# Patient Record
Sex: Male | Born: 1950 | Race: White | Hispanic: No | Marital: Married
Health system: Midwestern US, Community
[De-identification: ages and names within clinical notes are randomized; demographics above are authoritative.]

## PROBLEM LIST (undated history)

## (undated) DIAGNOSIS — G4733 Obstructive sleep apnea (adult) (pediatric): Secondary | ICD-10-CM

## (undated) DIAGNOSIS — D649 Anemia, unspecified: Secondary | ICD-10-CM

## (undated) DIAGNOSIS — E119 Type 2 diabetes mellitus without complications: Secondary | ICD-10-CM

## (undated) DIAGNOSIS — I10 Essential (primary) hypertension: Secondary | ICD-10-CM

## (undated) DIAGNOSIS — I272 Pulmonary hypertension, unspecified: Secondary | ICD-10-CM

## (undated) DIAGNOSIS — B1920 Unspecified viral hepatitis C without hepatic coma: Secondary | ICD-10-CM

## (undated) DIAGNOSIS — J449 Chronic obstructive pulmonary disease, unspecified: Secondary | ICD-10-CM

## (undated) DIAGNOSIS — M719 Bursopathy, unspecified: Secondary | ICD-10-CM

## (undated) HISTORY — DX: Anemia, unspecified: D64.9

## (undated) HISTORY — DX: Chronic obstructive pulmonary disease, unspecified: J44.9

## (undated) HISTORY — DX: Obstructive sleep apnea (adult) (pediatric): G47.33

## (undated) HISTORY — DX: Pulmonary hypertension, unspecified: I27.20

## (undated) HISTORY — DX: Type 2 diabetes mellitus without complications: E11.9

## (undated) HISTORY — DX: Unspecified viral hepatitis C without hepatic coma: B19.20

## (undated) HISTORY — DX: Essential (primary) hypertension: I10

## (undated) HISTORY — PX: TRANSURETHRAL RESECTION OF PROSTATE: SHX73

## (undated) HISTORY — DX: Bursopathy, unspecified: M71.9

---

## 2001-07-17 ENCOUNTER — Emergency Department (HOSPITAL_COMMUNITY): Admission: EM | Admit: 2001-07-17 | Discharge: 2001-07-17 | Payer: Self-pay | Admitting: *Deleted

## 2004-05-26 ENCOUNTER — Emergency Department (HOSPITAL_COMMUNITY): Admission: EM | Admit: 2004-05-26 | Discharge: 2004-05-26 | Payer: Self-pay | Admitting: Emergency Medicine

## 2010-01-13 ENCOUNTER — Emergency Department (HOSPITAL_COMMUNITY): Admission: EM | Admit: 2010-01-13 | Discharge: 2010-01-13 | Payer: Self-pay | Admitting: Emergency Medicine

## 2010-05-20 ENCOUNTER — Emergency Department (HOSPITAL_COMMUNITY): Admission: EM | Admit: 2010-05-20 | Discharge: 2010-05-20 | Payer: Self-pay | Admitting: Emergency Medicine

## 2010-06-05 ENCOUNTER — Ambulatory Visit: Payer: Self-pay | Admitting: Cardiology

## 2010-06-05 DIAGNOSIS — G4733 Obstructive sleep apnea (adult) (pediatric): Secondary | ICD-10-CM

## 2010-06-05 DIAGNOSIS — I517 Cardiomegaly: Secondary | ICD-10-CM | POA: Insufficient documentation

## 2010-06-05 DIAGNOSIS — I10 Essential (primary) hypertension: Secondary | ICD-10-CM | POA: Insufficient documentation

## 2010-06-05 DIAGNOSIS — J441 Chronic obstructive pulmonary disease with (acute) exacerbation: Secondary | ICD-10-CM | POA: Insufficient documentation

## 2010-06-07 ENCOUNTER — Encounter: Payer: Self-pay | Admitting: Cardiology

## 2010-06-11 ENCOUNTER — Telehealth (INDEPENDENT_AMBULATORY_CARE_PROVIDER_SITE_OTHER): Payer: Self-pay

## 2010-06-21 ENCOUNTER — Encounter: Payer: Self-pay | Admitting: Cardiology

## 2010-06-28 ENCOUNTER — Encounter: Payer: Self-pay | Admitting: Cardiology

## 2010-07-26 ENCOUNTER — Ambulatory Visit
Admission: RE | Admit: 2010-07-26 | Discharge: 2010-07-26 | Payer: Self-pay | Source: Home / Self Care | Attending: Cardiology | Admitting: Cardiology

## 2010-07-26 DIAGNOSIS — G4730 Sleep apnea, unspecified: Secondary | ICD-10-CM | POA: Insufficient documentation

## 2010-07-26 DIAGNOSIS — I10 Essential (primary) hypertension: Secondary | ICD-10-CM | POA: Insufficient documentation

## 2010-07-26 DIAGNOSIS — I2789 Other specified pulmonary heart diseases: Secondary | ICD-10-CM | POA: Insufficient documentation

## 2010-08-15 NOTE — Letter (Signed)
SummaryPricilla Holm RECORDS  [facility] RECORDS   Imported By: Faythe Ghee 06/07/2010 10:26:49  _____________________________________________________________________  External Attachment:    Type:   Image     Comment:   External Document

## 2010-08-15 NOTE — Letter (Signed)
Summary: External Correspondence  External Correspondence   Imported By: Faythe Ghee 06/28/2010 09:40:01  _____________________________________________________________________  External Attachment:    Type:   Image     Comment:   External Document

## 2010-08-15 NOTE — Progress Notes (Signed)
**Note De-Identified Harl Wiechmann Obfuscation** Summary: FYI: pt. sees Dr. Pincus Badder at [facility]  Phone Note Call from Patient   Caller: Patient Summary of Call: patient wanted to let you know that he sees Dr.Mullany at the [facility] / phone number is [phone] / tg Initial call taken by: Raechel Ache [facility],  June 11, 2010 11:15 AM  Follow-up for Phone Call        Noted and changed in chart. Follow-up by: Larita Fife Rachael Ferrie LPN,  June 11, 2010 11:39 AM

## 2010-08-15 NOTE — Letter (Signed)
Summary: [facility]   [facility]   Imported By: Claudette Laws 07/09/2010 13:45:54  _____________________________________________________________________  External Attachment:    Type:   Image     Comment:   External Document

## 2010-08-15 NOTE — Assessment & Plan Note (Signed)
Summary: ROV /6 WEEKS   Visit Type:  Follow-up Referring Provider:  Dr. Cherie Ouch Primary Provider:  Dr. Quintin Alto   History of Present Illness: 60 year old male presents for followup. He was seen initially back in November 2011, with previous workup done through the [facility] hospital system. Records reviewed from the [facility] show that he has been followed with a diagnoses of COPD, obstructive sleep apnea, morbid obesity, type diabetes mellitus, hepatitis C, and shoulder bursitis with adhesive capsulitis. He has a reported FEV1 of 1.98 with prior documentation of enlarged pulmonary arteries on chest CT scan.   Echocardiogram dated April 08, 2010 showed left ventricle at normal size with mild LVH, LVEF greater than 55% with grade 1 diastolic dysfunction. Right ventricle mildly dilated with normal function. Mild biatrial enlargement. RVSP greater than 60 mm mercury with description of severe pulmonary artery dilatation. No pericardial effusion. No other major valvular abnormalities.  I referred him to Dr. Orson Aloe back in December 2011 for pulmonary followup. He is also establishing with Dr. Leandrew Koyanagi soon.  We discussed his echocardiogram results. It is most likely that he has pulmonary hypertension related to chronic lung disease and obstructive sleep apnea. He has no evidence of left ventricular dysfunction or valvular abnormalities to explain this.  Reports stable dyspnea on exertion, no cough or hemoptysis.  Current Medications (verified): 1)  Fish Oil 1000 Mg Caps (Omega-3 Fatty Acids) .... 2 Tablets Three Times A Day 2)  Glyburide 5 Mg Tabs (Glyburide) .... 2 Tablets By Mouth Two Times A Day 3)  Hydrochlorothiazide 25 Mg Tabs (Hydrochlorothiazide) .... Take 1/2 Tablet By Mouth Once Daily 4)  Hydrocodone-Acetaminophen 5-500 Mg Tabs (Hydrocodone-Acetaminophen) .... Take 1 Tablet By Mouth Every 4 Hours As Needed For Pain 5)  Losartan Potassium 25 Mg Tabs (Losartan  Potassium) .... 1/2 Tablet By Mouth Once Daily 6)  Metformin Hcl 1000 Mg Tabs (Metformin Hcl) .Marland Kitchen.. 1 Tablet in Am and 1 and 1/2 At Night 7)  Pramipexole Dihydrochloride 0.125 Mg Tabs (Pramipexole Dihydrochloride) .Marland Kitchen.. 1 Tablet By Mouth At Bedtime 8)  Simvastatin 80 Mg Tabs (Simvastatin) .... Take 1 Tablet By Mouth Once Daily 9)  Topamax 25 Mg Tabs (Topiramate) .Marland Kitchen.. 1 Tablet Two Times A Day 10)  Asmanex 30 Metered Doses 220 Mcg/inh Aepb (Mometasone Furoate) .... 2 Puffs At Bedtime 11)  Foradil Aerolizer 12 Mcg Caps (Formoterol Fumarate) .... Inhale Two Times A Day 12)  Omeprazole 20 Mg Cpdr (Omeprazole) .... Take 1 Tab Daily 13)  Proair Hfa 108 (90 Base) Mcg/act Aers (Albuterol Sulfate) .... As Needed 14)  Ammonium Lactate 12 % Lotn (Ammonium Lactate) .... Apply Two Times A Day 15)  Capsaicin 0.025 % Crea (Capsaicin) .... Apply Three Times A Day 16)  Hydrocortisone 2.5 % Crea (Hydrocortisone) .... Apply Two Times A Day  Allergies (verified): No Known Drug Allergies  Comments:  Nurse/Medical Assistant: patient uses [facility]  Past History:  Social History: Last updated: 06/05/2010 Tobacco Use - Former, quit December 2007 Alcohol Use - no Single - lives with elderly mother Disabled - several jobs over time, most recently in [employer] estate down in [place]  Past Medical History: COPD, FEV1 1.9 Hypertension Anemia Diabetes Type 2 Obstructive sleep apnea - CPAP Hepatitis C Shoulder bursitis with adhesive capsulitis Pulmonary hypertension  Review of Systems       The patient complains of dyspnea on exertion and peripheral edema.  The patient denies anorexia, fever, weight loss, chest pain, syncope, prolonged cough, headaches, hemoptysis, abdominal pain, melena,  and hematochezia.         Otherwise reviewed and negative.  Vital Signs:  Patient profile:   60 year old male Weight:      268 pounds BMI:     38.59 Pulse rate:   87 / minute BP sitting:   156 / 93  (left arm)  Vitals  Entered By: Dreama Saa, CNA (July 26, 2010 12:43 PM)  Physical Exam  Additional Exam:  Morbidly obese male in no acute distress. HEENT: Conjunctiva and lids normal, oropharynx with moist mucosa. Neck: Supple, no elevated JVP without bruits, increased girth. Lungs: Diminished breath sounds throughout, and nonlabored and no active wheezing. Cardiac: Indistinct PMI with regular rate and rhythm, no obvious S3 without systolic murmur. Abdomen: Obese, nontender, no obvious hepatomegaly, bowel sounds present. Extremities: Venous stasis changes right greater than left with varicosities also noted. No obvious cords. Chronic appearing edema that is relatively symmetrical, 1+ overall. Musculoskeletal: No gross deformities. Skin: Otherwise warm and dry. Neuropsychiatric: Alert and oriented x3, affect appropriate.   Impression & Recommendations:  Problem # 1:  OTHER CHRONIC PULMONARY HEART DISEASES (ICD-416.8)  Moderate pulmonary hypertension, most likely related to chronic lung disease and obstructive sleep apnea. Echocardiogram done through the [facility] hospital system back in September 2011 demonstrated normal left ventricular systolic function without major valvular abnormalities. He now has regular pulmonary followup scheduled with Dr. Orson Aloe will be establishing with Dr. Leandrew Koyanagi for primary care. Could always consider the possibility of a CT angiogram of the chest to exclude thromboembolic disease, although this seems less likely. Otherwise we will plan a followup echocardiogram over the next 6 months for reassessment, sooner if his symptoms worsen.  Problem # 2:  COPD (ICD-496)  Followed by Dr. Orson Aloe.  His updated medication list for this problem includes:    Asmanex 30 Metered Doses 220 Mcg/inh Aepb (Mometasone furoate) .Marland Kitchen... 2 puffs at bedtime    Foradil Aerolizer 12 Mcg Caps (Formoterol fumarate) ..... Inhale two times a day    Proair Hfa 108 (90 Base) Mcg/act Aers (Albuterol  sulfate) .Marland Kitchen... As needed  Problem # 3:  SLEEP APNEA, OBSTRUCTIVE (ICD-327.23)  Followed by Dr. Orson Aloe.  Problem # 4:  MORBID OBESITY (ICD-278.01)  Diet and weight loss were discussed.  Problem # 5:  ESSENTIAL HYPERTENSION, BENIGN (ICD-401.1)  Blood pressure elevated today. Patient states he he took his medications late today. I asked him to keep an eye on this, reviewed sodium restriction, and also weight loss. He may need further up titration of medical therapy by primary care over time.  His updated medication list for this problem includes:    Hydrochlorothiazide 25 Mg Tabs (Hydrochlorothiazide) .Marland Kitchen... Take 1/2 tablet by mouth once daily    Losartan Potassium 25 Mg Tabs (Losartan potassium) .Marland Kitchen... 1/2 tablet by mouth once daily  Other Orders: 2-D Echocardiogram (2D Echo)  Patient Instructions: 1)  Your physician recommends that you schedule a follow-up appointment in: 6months 2)  Your physician recommends that you continue on your current medications as directed. Please refer to the Current Medication list given to you today. 3)  Your physician has requested that you have an echocardiogram.  Echocardiography is a painless test that uses sound waves to create images of your heart. It provides your doctor with information about the size and shape of your heart and how well your heart's chambers and valves are working.  This procedure takes approximately one hour. There are no restrictions for this procedure.

## 2010-08-15 NOTE — Assessment & Plan Note (Signed)
Summary: Bradley [facility] for [facility]   Visit Type:  Initial Consult Referring Provider:  Dr. Valeria Batman Primary Provider:  [facility] system   History of Present Illness: 60 year old male referred for cardiology consultation. Presentation is somewhat convoluted. In speaking with the patient, he reportedly follows with both a primary care provider and a pulmonologist through the [facility] system. He is trying to establish followup closer to this area however. He states he underwent an echocardiogram back in September, and that his pulmonologist called him to tell him that his right heart was somewhat enlarged, probably related to "strain." He tells me that it was explained that he should go to the emergency department for further evaluation. He indicates that he had been experiencing some chest discomfort and shortness of breath in the evenings around that time. He also indicates that he had not been on CPAP for several months, since he was waiting on proper tubing to arrive for a new nasal mask.  Records indicate that he was seen in the [facility] emergency department back on 7 November with chest pain. ECG revealed normal sinus rhythm at 80 beats per minutes, no acute ST segment changes. Laboratory data showed hemoglobin 14.8, platelets 173, troponin I less than 0.05, CK-MB 8.0, sodium 137, potassium 4.0, BUN 16, creatinine 1.3. Chest x-ray was also obtained reporting patchy basilar opacities suggestive of atelectasis, but no obvious consolidation. Heart size and mediastinal contours are described as normal in the final report.  Bradley Fuller states that he is now back on CPAP for at least the last week, starting to feel better. He does not endorse any truly exertional chest pain. He has occasional cough, no hemoptysis. He states he has tried to find a primary care provider in [place], however has been unsuccessful.  He reports no personal history of CAD or myocardial  infarction. He has had chronic problems with lower extremity edema, also stasis changes.  Preventive Screening-Counseling & Management  Alcohol-Tobacco     Smoking Status: quit  Current Medications (verified): 1)  Fish Oil 1000 Mg Caps (Omega-3 Fatty Acids) .... 2 Tablets Three Times A Day 2)  Glyburide 5 Mg Tabs (Glyburide) .... 2 Tablets By Mouth Two Times A Day 3)  Hydrochlorothiazide 25 Mg Tabs (Hydrochlorothiazide) .... Take 1/2 Tablet By Mouth Once Daily 4)  Hydrocodone-Acetaminophen 5-500 Mg Tabs (Hydrocodone-Acetaminophen) .... Take 1 Tablet By Mouth Every 4 Hours As Needed For Pain 5)  Losartan Potassium 25 Mg Tabs (Losartan Potassium) .... 1/2 Tablet By Mouth Once Daily 6)  Metformin Hcl 1000 Mg Tabs (Metformin Hcl) .Marland Kitchen.. 1 Tablet in Am and 1 and 1/2 At Night 7)  Pramipexole Dihydrochloride 0.125 Mg Tabs (Pramipexole Dihydrochloride) .Marland Kitchen.. 1 Tablet By Mouth At Bedtime 8)  Simvastatin 80 Mg Tabs (Simvastatin) .... Take 1 Tablet By Mouth Once Daily 9)  Topamax 25 Mg Tabs (Topiramate) .Marland Kitchen.. 1 Tablet Two Times A Day 10)  Asmanex 30 Metered Doses 220 Mcg/inh Aepb (Mometasone Furoate) .... 2 Puffs At Bedtime 11)  Foradil Aerolizer 12 Mcg Caps (Formoterol Fumarate) .... Inhale Two Times A Day  Allergies (verified): No Known Drug Allergies  Past History:  Family History: Last updated: 07-05-2010 Father: died age 55 with throat cancer Mother: alive age 53 Family History of Diabetes  Social History: Last updated: 2010-07-05 Tobacco Use - Former, quit December 2007 Alcohol Use - no Single - lives with elderly mother Disabled - several jobs over time, most recently in [employer] estate down  in [place]  Past Medical History: COPD Hypertension Anemia Diabetes Type 2 Obstructive sleep apnea - CPAP  Past Surgical History: TURP  Family History: Father: died age 69 with throat cancer Mother: alive age 23 Family History of Diabetes  Social History: Tobacco Use - Former, quit  December 2007 Alcohol Use - no Single - lives with elderly mother Disabled - several jobs over time, most recently in [employer] estate down in [place] Smoking Status:  quit  Review of Systems  The patient denies anorexia, fever, weight loss, syncope, prolonged cough, headaches, hemoptysis, abdominal pain, melena, and hematochezia.         Otherwise negative except as outlined above.  Vital Signs:  Patient profile:   60 year old male Height:      70 inches Weight:      267 pounds BMI:     38.45 O2 Sat:      97 % on Room air Pulse rate:   80 / minute BP sitting:   127 / 85  (left arm)  O2 Flow:  Room air  Physical Exam  Additional Exam:  Morbidly obese male in no acute distress. HEENT: Conjunctiva and lids normal, oropharynx with moist mucosa. Neck: Supple, no elevated JVP without bruits, increased girth. Lungs: Diminished breath sounds throughout, and nonlabored and no active wheezing. Cardiac: Indistinct PMI with regular rate and rhythm, no obvious S3 without systolic murmur. Abdomen: Obese, nontender, no obvious hepatomegaly, bowel sounds present. Extremities: Venous stasis changes right greater than left with varicosities also noted. No obvious cords. Chronic appearing edema that is relatively symmetrical, 1+ overall. Musculoskeletal: No gross deformities. Skin: Otherwise warm and dry. Neuropsychiatric: Alert and oriented x3, affect appropriate.   CXR  Procedure date:  05/20/2010  Findings:      CHEST - 2 VIEW    Comparison: None.    Findings: There are low lung volumes.  The heart size and   mediastinal contours are normal.  Patchy opacities in both lung   bases are most consistent with atelectasis.  There is no   consolidation, edema or pleural effusion.  No acute osseous   findings are identified.    IMPRESSION:   Patchy basilar opacities most consistent with atelectasis.  No   consolidation.  EKG  Procedure date:  06/05/2010  Findings:      Normal sinus  rhythm at 74 beats per minute.  Impression & Recommendations:  Problem # 1:  CARDIOMEGALY (ICD-429.3)  Details regarding this diagnosis are pending. Actually his heart size appeared normal by recent chest x-ray, although based on his description, it sounds like he may have been found to have some degree of right ventricular enlargement on echocardiogram done through the [facility] system back in September. This could certainly be the case in the setting of COPD, morbid obesity, and obstructive sleep apnea. His ECG does not reveal any obvious RV strain pattern however, and is otherwise normal. Plan is to obtain the patient's echocardiogram report from September. We may not need to pursue further testing at this time unless he manifests any progressive symptoms. I will bring him back to discuss the results.  Problem # 2:  ESSENTIAL HYPERTENSION, BENIGN (ICD-401.1)  Blood pressure control today.  His updated medication list for this problem includes:    Hydrochlorothiazide 25 Mg Tabs (Hydrochlorothiazide) .Marland Kitchen... Take 1/2 tablet by mouth once daily    Losartan Potassium 25 Mg Tabs (Losartan potassium) .Marland Kitchen... 1/2 tablet by mouth once daily  Problem # 3:  COPD (  ICD-496)  It sounds as if the patient underwent pulmonary function testing down in [place]. He has had a pulmonologist through the [facility] system. No other details are available. In light of this and his obstructive sleep apnea, he would like to obtain closer pulmonary followup. Will make a referral to Dr. Orson Aloe in [place].  His updated medication list for this problem includes:    Asmanex 30 Metered Doses 220 Mcg/inh Aepb (Mometasone furoate) .Marland Kitchen... 2 puffs at bedtime    Foradil Aerolizer 12 Mcg Caps (Formoterol fumarate) ..... Inhale two times a day  Orders: Pulmonary Referral (Pulmonary)  Problem # 4:  SLEEP APNEA, OBSTRUCTIVE (ICD-327.23)  Reportedly back on CPAP and feeling somewhat better. As noted above, will make  referral to Dr. Orson Aloe in [place].  Problem # 5:  MORBID OBESITY (ICD-278.01)  Weight loss was discussed. Certainly needs to continue risk factor modification strategies. He does need to establish with a primary care provider to assist with this. We gave him names of some physician practices in [place].  Patient Instructions: 1)  Your physician recommends that you schedule a follow-up appointment in: 6 WEEKS 2)  You have been referred to PRIMARY CARE DOCTOR AND PULMONONOLOGIST  Prevention & Chronic Care Immunizations   Influenza vaccine: Not documented    Tetanus booster: Not documented    Pneumococcal vaccine: Not documented    H. zoster vaccine: Not documented  Colorectal Screening   Hemoccult: Not documented    Colonoscopy: Not documented  Other Screening   PSA: Not documented   Smoking status: quit  (06/05/2010)    Screening comments: quit at age 82  Lipids   Total Cholesterol: Not documented   LDL: Not documented   LDL Direct: Not documented   HDL: Not documented   Triglycerides: Not documented  Hypertension   Last Blood Pressure: 127 / 85  (06/05/2010)   Serum creatinine: Not documented   Serum potassium Not documented  Self-Management Support :    Hypertension self-management support: Not documented   Appended Document: Bradley Fuller seen at [facility] for [facility] Records reviewed from [facility]. He hs ben followed with a diagnoses of COPD, obstructive sleep apnea, morbid obesity, type diabetes mellitus, hepatitis C, and shoulder bursitis with adhesive capsulitis. He has a reported FEV1 of 1.98 with prior documentation of enlarged pulmonary arteries on chest CT scan. Unfortunately no echocardiogram report was sent.  Appended Document: Bradley Fuller seen at [facility] for chest pain Received echocardiogram report from the [facility] system. Study dated April 08, 2010. Left ventricle described as normal size with mild LVH, LVEF greater than 55% with grade 1  diastolic dysfunction. Right ventricle mildly dilated with normal function. Mild biatrial enlargement. RVSP greater than 60 mm mercury with description of severe pulmonary artery dilatation. No pericardial effusion. No other major valvular abnormalities.

## 2010-09-24 LAB — BASIC METABOLIC PANEL
Calcium: 9.6 mg/dL (ref 8.4–10.5)
Creatinine, Ser: 1.3 mg/dL (ref 0.4–1.5)
GFR calc Af Amer: >60 mL/min (ref >60)

## 2010-09-24 LAB — POCT CARDIAC MARKERS
CKMB, poc: 8 ng/mL (ref 1.0–8.0)
Troponin i, poc: <0.05 ng/mL (ref 0.00–0.09)

## 2010-09-24 LAB — DIFFERENTIAL
Lymphocytes Relative: 40 % (ref 12–46)
Lymphs Abs: 3.1 10*3/uL (ref 0.7–4.0)
Neutrophils Relative %: 47 % (ref 43–77)

## 2010-09-24 LAB — CBC
Platelets: 171 10*3/uL (ref 150–400)
RBC: 4.67 MIL/uL (ref 4.22–5.81)
WBC: 7.6 10*3/uL (ref 4.0–10.5)

## 2010-09-24 LAB — D-DIMER, QUANTITATIVE: D-Dimer, Quant: 0.22 ug/mL-FEU (ref 0.00–0.48)

## 2010-09-27 ENCOUNTER — Encounter: Payer: Self-pay | Admitting: Cardiology

## 2010-10-10 NOTE — Letter (Signed)
Summary: dr Orson Aloe office note  dr Orson Aloe office note   Imported By: Faythe Ghee 09/27/2010 16:44:53  _____________________________________________________________________  External Attachment:    Type:   Image     Comment:   External Document

## 2011-01-23 ENCOUNTER — Encounter: Payer: Self-pay | Admitting: Cardiology

## 2011-01-30 ENCOUNTER — Ambulatory Visit (HOSPITAL_COMMUNITY)
Admission: RE | Admit: 2011-01-30 | Discharge: 2011-01-30 | Disposition: A | Discharge status: Home / Self Care | Payer: Medicare Other | Source: Ambulatory Visit | Attending: Cardiology | Admitting: Cardiology

## 2011-01-30 DIAGNOSIS — I517 Cardiomegaly: Secondary | ICD-10-CM | POA: Insufficient documentation

## 2011-01-30 DIAGNOSIS — I10 Essential (primary) hypertension: Secondary | ICD-10-CM | POA: Insufficient documentation

## 2011-01-30 DIAGNOSIS — J449 Chronic obstructive pulmonary disease, unspecified: Secondary | ICD-10-CM | POA: Insufficient documentation

## 2011-01-30 DIAGNOSIS — J4489 Other specified chronic obstructive pulmonary disease: Secondary | ICD-10-CM | POA: Insufficient documentation

## 2011-01-30 DIAGNOSIS — I369 Nonrheumatic tricuspid valve disorder, unspecified: Secondary | ICD-10-CM

## 2011-01-30 DIAGNOSIS — G4733 Obstructive sleep apnea (adult) (pediatric): Secondary | ICD-10-CM | POA: Insufficient documentation

## 2011-01-30 NOTE — Progress Notes (Signed)
*  PRELIMINARY RESULTS* Echocardiogram 2D Echocardiogram has been performed.  Bradley Fuller 01/30/2011, 8:59 AM

## 2011-01-31 ENCOUNTER — Ambulatory Visit (INDEPENDENT_AMBULATORY_CARE_PROVIDER_SITE_OTHER): Payer: Medicare Other | Admitting: Cardiology

## 2011-01-31 ENCOUNTER — Encounter: Payer: Self-pay | Admitting: Cardiology

## 2011-01-31 VITALS — BP 124/88 | HR 82 | Ht 70.0 in | Wt 265.0 lb

## 2011-01-31 DIAGNOSIS — I2789 Other specified pulmonary heart diseases: Secondary | ICD-10-CM

## 2011-01-31 DIAGNOSIS — E782 Mixed hyperlipidemia: Secondary | ICD-10-CM

## 2011-01-31 DIAGNOSIS — I10 Essential (primary) hypertension: Secondary | ICD-10-CM

## 2011-01-31 NOTE — Patient Instructions (Signed)
**Note De-identified Bradley Fuller Obfuscation** Your physician recommends that you continue on your current medications as directed. Please refer to the Current Medication list given to you today.  Your physician recommends that you schedule a follow-up appointment in: As needed 

## 2011-01-31 NOTE — Assessment & Plan Note (Signed)
Blood pressure trend is better. Continue medical therapy, and follow up with Dr. Leandrew Koyanagi.

## 2011-01-31 NOTE — Progress Notes (Signed)
Clinical Summary Bradley Fuller is a 60 y.o.male presenting for followup. He was seen back in January. Followup echocardiogram is noted below. I reviewed the images. There was only trace to mild tricuspid regurgitation without clear evidence of significantly increased transvalvular velocity to suggest pulmonary hypertension. This would represent a significant improvement compared to the prior study report.  We reviewed the results today. He reports feeling fairly well, no progressive shortness of breath, no cough or hemoptysis, no chest pain. ECG is noted below.  He did have recent followup lab work through the [facility] system showing potassium 4.4, BUN 20, creatinine 1.5, hemoglobin A1c down to 8.1% from 12.2% last year. Lipids from last year showed LDL 74. He is now following with Dr. Leandrew Koyanagi and Dr. Orson Aloe.   No Known Allergies  Current outpatient prescriptions:ammonium lactate (LAC-HYDRIN) 12 % lotion, Apply 1 application topically 2 (two) times daily.  , Disp: , Rfl: ;  budesonide-formoterol (SYMBICORT) 160-4.5 MCG/ACT inhaler, Inhale 2 puffs into the lungs 2 (two) times daily.  , Disp: , Rfl: ;  capsaicin (ZOSTRIX) 0.025 % cream, Apply topically 3 (three) times daily.  , Disp: , Rfl: ;  Cetirizine HCl 10 MG CAPS, Take 1 capsule by mouth daily.  , Disp: , Rfl:  fluticasone (FLONASE) 50 MCG/ACT nasal spray, Place 2 sprays into the nose daily.  , Disp: , Rfl: ;  formoterol (FORADIL) 12 MCG capsule for inhaler, Place 12 mcg into inhaler and inhale 2 (two) times daily.  , Disp: , Rfl: ;  glyBURIDE (DIABETA) 5 MG tablet, Take 10 mg by mouth 2 (two) times daily with a meal.  , Disp: , Rfl: ;  hydrochlorothiazide 25 MG tablet, Take 12.5 mg by mouth daily.  , Disp: , Rfl:  HYDROcodone-acetaminophen (VICODIN) 5-500 MG per tablet, Take 1 tablet by mouth every 6 (six) hours as needed.  , Disp: , Rfl: ;  hydrocortisone 2.5 % cream, Apply 1 application topically 2 (two) times daily.  , Disp: , Rfl: ;  losartan  (COZAAR) 25 MG tablet, Take 12.5 mg by mouth daily.  , Disp: , Rfl: ;  metFORMIN (GLUCOPHAGE) 1000 MG tablet, Take 1 tablet in the AM and 1.5 tablet in the PM , Disp: , Rfl:  mometasone (ASMANEX) 220 MCG/INH inhaler, Inhale 2 puffs into the lungs daily.  , Disp: , Rfl: ;  Omega-3 Fatty Acids (FISH OIL) 1000 MG CAPS, Take 2 capsules by mouth 3 (three) times daily.  , Disp: , Rfl: ;  omeprazole (PRILOSEC) 20 MG capsule, Take 20 mg by mouth daily.  , Disp: , Rfl: ;  pramipexole (MIRAPEX) 0.125 MG tablet, Take 0.125 mg by mouth at bedtime.  , Disp: , Rfl:  simvastatin (ZOCOR) 80 MG tablet, Take 80 mg by mouth at bedtime.  , Disp: , Rfl: ;  topiramate (TOPAMAX) 25 MG tablet, Take 25 mg by mouth 2 (two) times daily.  , Disp: , Rfl:   Past Medical History  Diagnosis Date  . COPD (chronic obstructive pulmonary disease)     FEV1 1.9  . Essential hypertension, benign   . Anemia   . Type 2 diabetes mellitus   . OSA (obstructive sleep apnea)     CPAP  . Hepatitis C   . Bursitis     Shoulder, with adhesive capsulitis  . Pulmonary hypertension     Secondary    Past Surgical History  Procedure Date  . Transurethral resection of prostate     Family History  Problem  Relation Age of Onset  . Throat cancer Father   . Diabetes      Social History Bradley Fuller reports that he quit smoking about 4 years ago. His smoking use included Cigarettes. He has never used smokeless tobacco. Bradley Fuller reports that he does not drink alcohol.  Review of Systems Otherwise reviewed and negative.  Physical Examination Filed Vitals:   01/31/11 1414  BP: 124/88  Pulse: 82  Morbidly obese male in no acute distress.  HEENT: Conjunctiva and lids normal, oropharynx with moist mucosa.  Neck: Supple, no elevated JVP without bruits, increased girth.  Lungs: Diminished breath sounds throughout, and nonlabored and no active wheezing.  Cardiac: Indistinct PMI with regular rate and rhythm, no obvious S3 without systolic  murmur.  Abdomen: Obese, nontender, no obvious hepatomegaly, bowel sounds present.  Extremities: Venous stasis changes right greater than left with varicosities also noted. No obvious cords. Chronic appearing edema that is relatively symmetrical, 1+ overall.  Musculoskeletal: No gross deformities.  Skin: Otherwise warm and dry.  Neuropsychiatric: Alert and oriented x3, affect appropriate.    ECG Sinus rhythm at 89 beats per minute with left atrial enlargement, nonspecific T-wave changes, decreased R wave progression.  Studies Echocardiogram 01/30/2011: - Left ventricle: The cavity size was normal. Wall thickness was increased in a pattern of moderate LVH. Systolic function was normal. The estimated ejection fraction was in the range of 55% to 60%. Wall motion was normal; there were no regional wall motion abnormalities. - Left atrium: The atrium was mildly dilated. - Atrial septum: No defect or patent foramen ovale was identified.    Problem List and Plan

## 2011-01-31 NOTE — Assessment & Plan Note (Signed)
Recent followup echocardiogram shows no clear evidence of pulmonary hypertension with only trace to mild tricuspid regurgitation and normal RV function. I reviewed this with the patient today. Main focus should be for him to followup for treatment of COPD and obstructive sleep apnea. He will continue to see Dr. Orson Aloe.

## 2011-03-23 ENCOUNTER — Emergency Department (HOSPITAL_COMMUNITY)
Admission: EM | Admit: 2011-03-23 | Discharge: 2011-03-23 | Disposition: A | Discharge status: Home / Self Care | Payer: Medicare Other | Attending: Emergency Medicine | Admitting: Emergency Medicine

## 2011-03-23 ENCOUNTER — Encounter (HOSPITAL_COMMUNITY): Payer: Self-pay

## 2011-03-23 DIAGNOSIS — J4489 Other specified chronic obstructive pulmonary disease: Secondary | ICD-10-CM | POA: Insufficient documentation

## 2011-03-23 DIAGNOSIS — Z87891 Personal history of nicotine dependence: Secondary | ICD-10-CM | POA: Insufficient documentation

## 2011-03-23 DIAGNOSIS — J449 Chronic obstructive pulmonary disease, unspecified: Secondary | ICD-10-CM | POA: Insufficient documentation

## 2011-03-23 DIAGNOSIS — B1920 Unspecified viral hepatitis C without hepatic coma: Secondary | ICD-10-CM | POA: Insufficient documentation

## 2011-03-23 DIAGNOSIS — E119 Type 2 diabetes mellitus without complications: Secondary | ICD-10-CM | POA: Insufficient documentation

## 2011-03-23 DIAGNOSIS — I10 Essential (primary) hypertension: Secondary | ICD-10-CM | POA: Insufficient documentation

## 2011-03-23 LAB — BASIC METABOLIC PANEL
CO2: 23 mEq/L (ref 19–32)
Calcium: 10.4 mg/dL (ref 8.4–10.5)
Creatinine, Ser: 0.88 mg/dL (ref 0.50–1.35)

## 2011-03-23 MED ORDER — SODIUM CHLORIDE 0.9 % IV BOLUS (SEPSIS)
1000.0000 mL | Freq: Once | INTRAVENOUS | Status: AC
  Administered 2011-03-23: 1000 mL via INTRAVENOUS

## 2011-03-23 MED ORDER — LISINOPRIL 10 MG PO TABS
ORAL_TABLET | ORAL | Status: AC
  Filled 2011-03-23: qty 1

## 2011-03-23 MED ORDER — LISINOPRIL 20 MG PO TABS: 10.0000 mg | ORAL_TABLET | Freq: Every day | ORAL | Status: DC

## 2011-03-23 MED ORDER — LISINOPRIL 5 MG PO TABS
5.0000 mg | ORAL_TABLET | Freq: Once | ORAL | Status: AC
  Administered 2011-03-23: 10 mg via ORAL
  Filled 2011-03-23: qty 1

## 2011-03-23 NOTE — ED Provider Notes (Signed)
History     CSN: [id number] Bradley Fuller is a 60 year old male with a history of hypertension and diabetes. He  says that his blood pressure has been in the 170 range for the past few days and he feels "" woozy.  He he denies pain anywhere else. he denies a headache nausea vomiting vision changes.  He is taking losartan and hydrochlorothiazide he has not changed his medications recently.  In Arrival date & time: 03/23/2011  3:22 PM  Chief Complaint  Patient presents with  . Hypertension   The history is provided by the patient.    Past Medical History  Diagnosis Date  . COPD (chronic obstructive pulmonary disease)     FEV1 1.9  . Essential hypertension, benign   . Anemia   . Type 2 diabetes mellitus   . OSA (obstructive sleep apnea)     CPAP  . Hepatitis C   . Bursitis     Shoulder, with adhesive capsulitis  . Pulmonary hypertension     Secondary    Past Surgical History  Procedure Date  . Transurethral resection of prostate     Family History  Problem Relation Age of Onset  . Throat cancer Father   . Diabetes      History  Substance Use Topics  . Smoking status: Former Smoker    Types: Cigarettes    Quit date: 06/13/2006  . Smokeless tobacco: Never Used  . Alcohol Use: No      Review of Systems  Constitutional: Negative for fever and diaphoresis.  HENT: Negative for neck pain.   Eyes: Negative for redness.  Respiratory: Negative for cough, chest tightness and shortness of breath.   Cardiovascular: Negative for chest pain and palpitations.  Gastrointestinal: Negative for nausea, vomiting, abdominal pain and diarrhea.  Musculoskeletal: Negative for back pain.  Neurological: Negative for headaches.  Psychiatric/Behavioral: Negative for confusion.    Physical Exam  BP 167/101  Pulse 89  Temp(Src) 97.9 F (36.6 C) (Oral)  Resp 20  Ht 5\' 10"  (1.778 m)  Wt 263 lb (119.296 kg)  BMI 37.74 kg/m2  SpO2 98%  Physical Exam  Constitutional: He is oriented to  person, place, and time. He appears well-developed and well-nourished. No distress.  HENT:  Head: Normocephalic and atraumatic.  Eyes: EOM are normal. Pupils are equal, round, and reactive to light.  Neck: Normal range of motion. Neck supple.  Cardiovascular: Normal rate and regular rhythm.   Murmur heard. Pulmonary/Chest: Effort normal and breath sounds normal. No respiratory distress. He has no wheezes. He has no rales.  Abdominal: Soft. Bowel sounds are normal. He exhibits no distension and no mass. There is no tenderness. There is no rebound and no guarding.  Musculoskeletal: Normal range of motion. He exhibits no edema and no tenderness.  Neurological: He is alert and oriented to person, place, and time. No cranial nerve deficit.  Skin: Skin is warm and dry. He is not diaphoretic.  Psychiatric: He has a normal mood and affect. His behavior is normal.    ED Course  Procedures  4:23 PM we will perform laboratory testing and hydrate with IV fluids in the  evaluation this patient with mild hypertension and wooziness but no other symptoms at this time  Results for orders placed during the hospital encounter of 03/23/11  BASIC METABOLIC PANEL      Component Value Range   Sodium 133 (*) 135 - 145 (mEq/L)   Potassium 3.7  3.5 - 5.1 (mEq/L)  Chloride 99  96 - 112 (mEq/L)   CO2 23  19 - 32 (mEq/L)   Glucose, Bld 184 (*) 70 - 99 (mg/dL)   BUN 19  6 - 23 (mg/dL)   Creatinine, Ser 1.61  0.50 - 1.35 (mg/dL)   Calcium 09.6  8.4 - 10.5 (mg/dL)   GFR calc non Af Amer >60  >60 (mL/min)   GFR calc Af Amer >60  >60 (mL/min)    MDM Mild htn in ed. No end organ damage.  BP improved. No signs renal insufficiency.        Nicholes Stairs, MD 03/23/11 [phone]

## 2011-03-23 NOTE — ED Notes (Signed)
Pt left the er stqating no needs

## 2011-03-23 NOTE — ED Notes (Signed)
Pt reports elevated bp for the past 3 days and dizziness.    Pt also has blistery rash to left forearm since Tuesday.  Says started after digging in flowers.

## 2011-10-30 IMAGING — CR DG CHEST 2V
2 series · 2 of 2 positions shown · non-contrast
Comparison: None.

CLINICAL DATA: Shortness of breath with chest pain and leg cramps.
History of hypertension and diabetes.

CHEST - 2 VIEW

[view not recorded (1 of 2)]
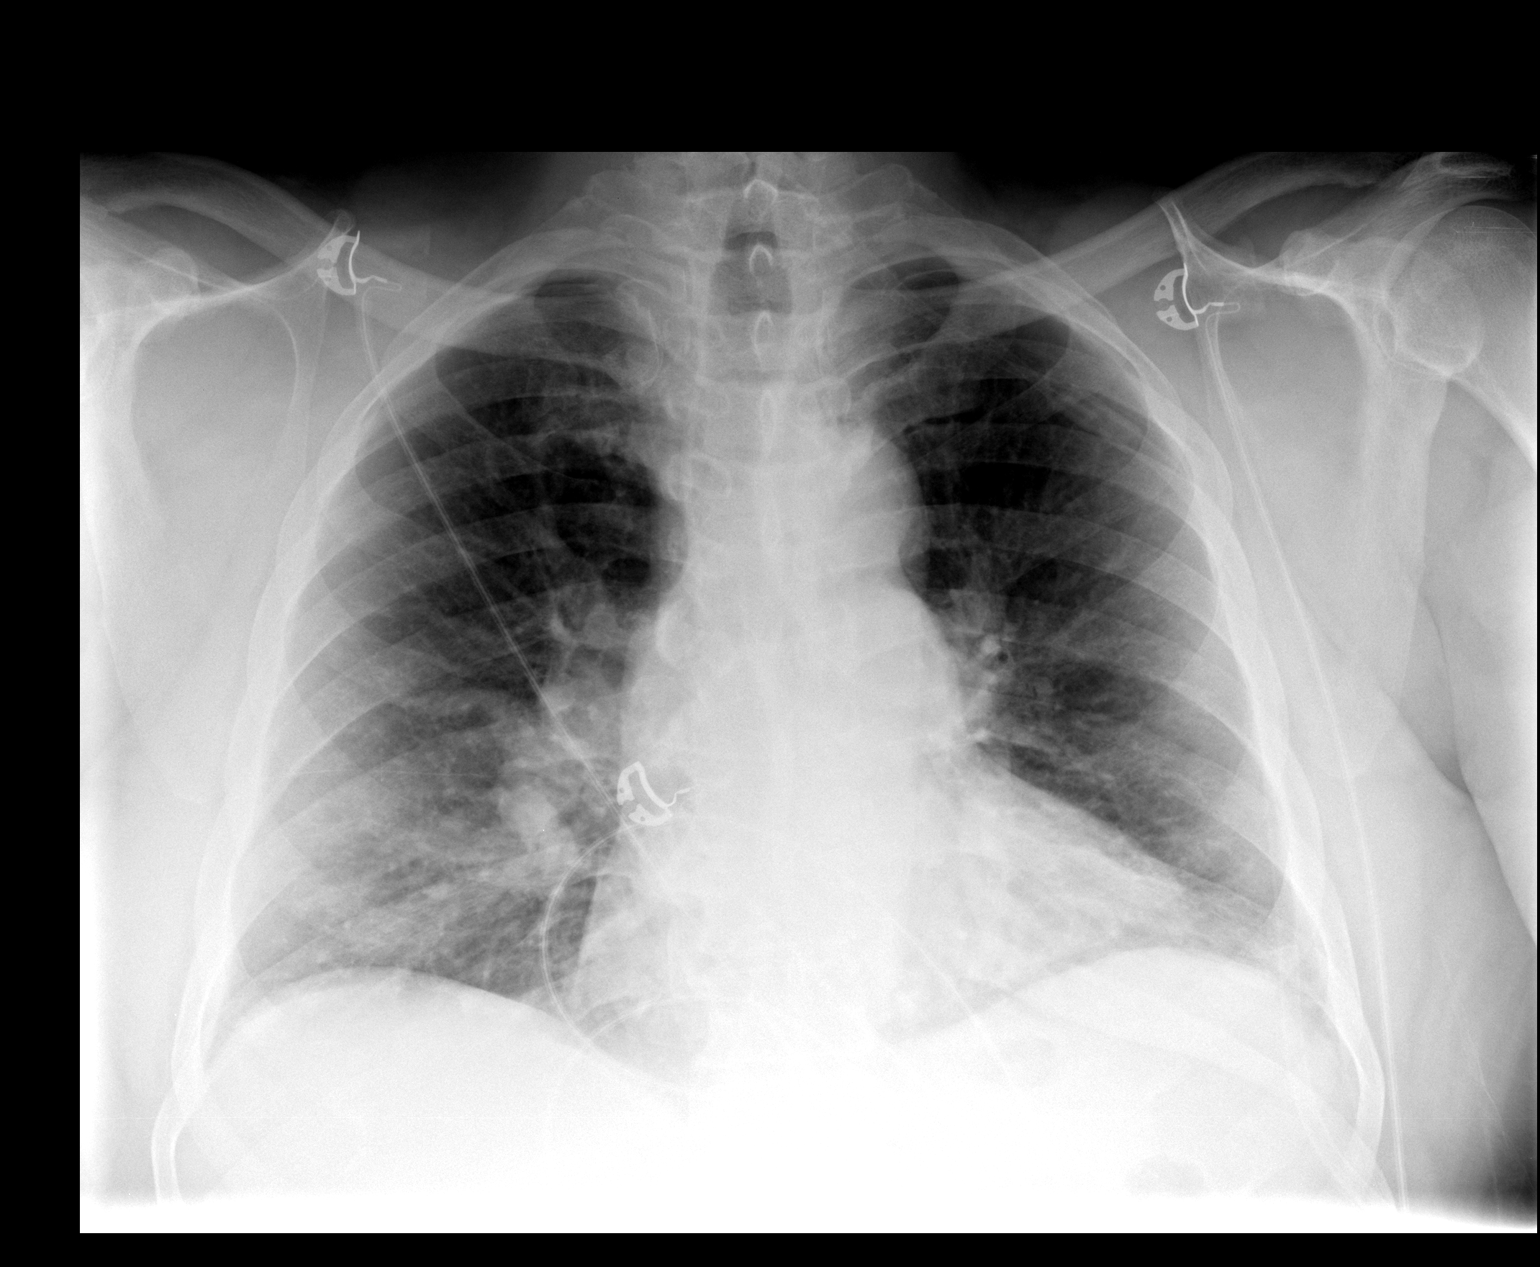

[view not recorded (2 of 2)]
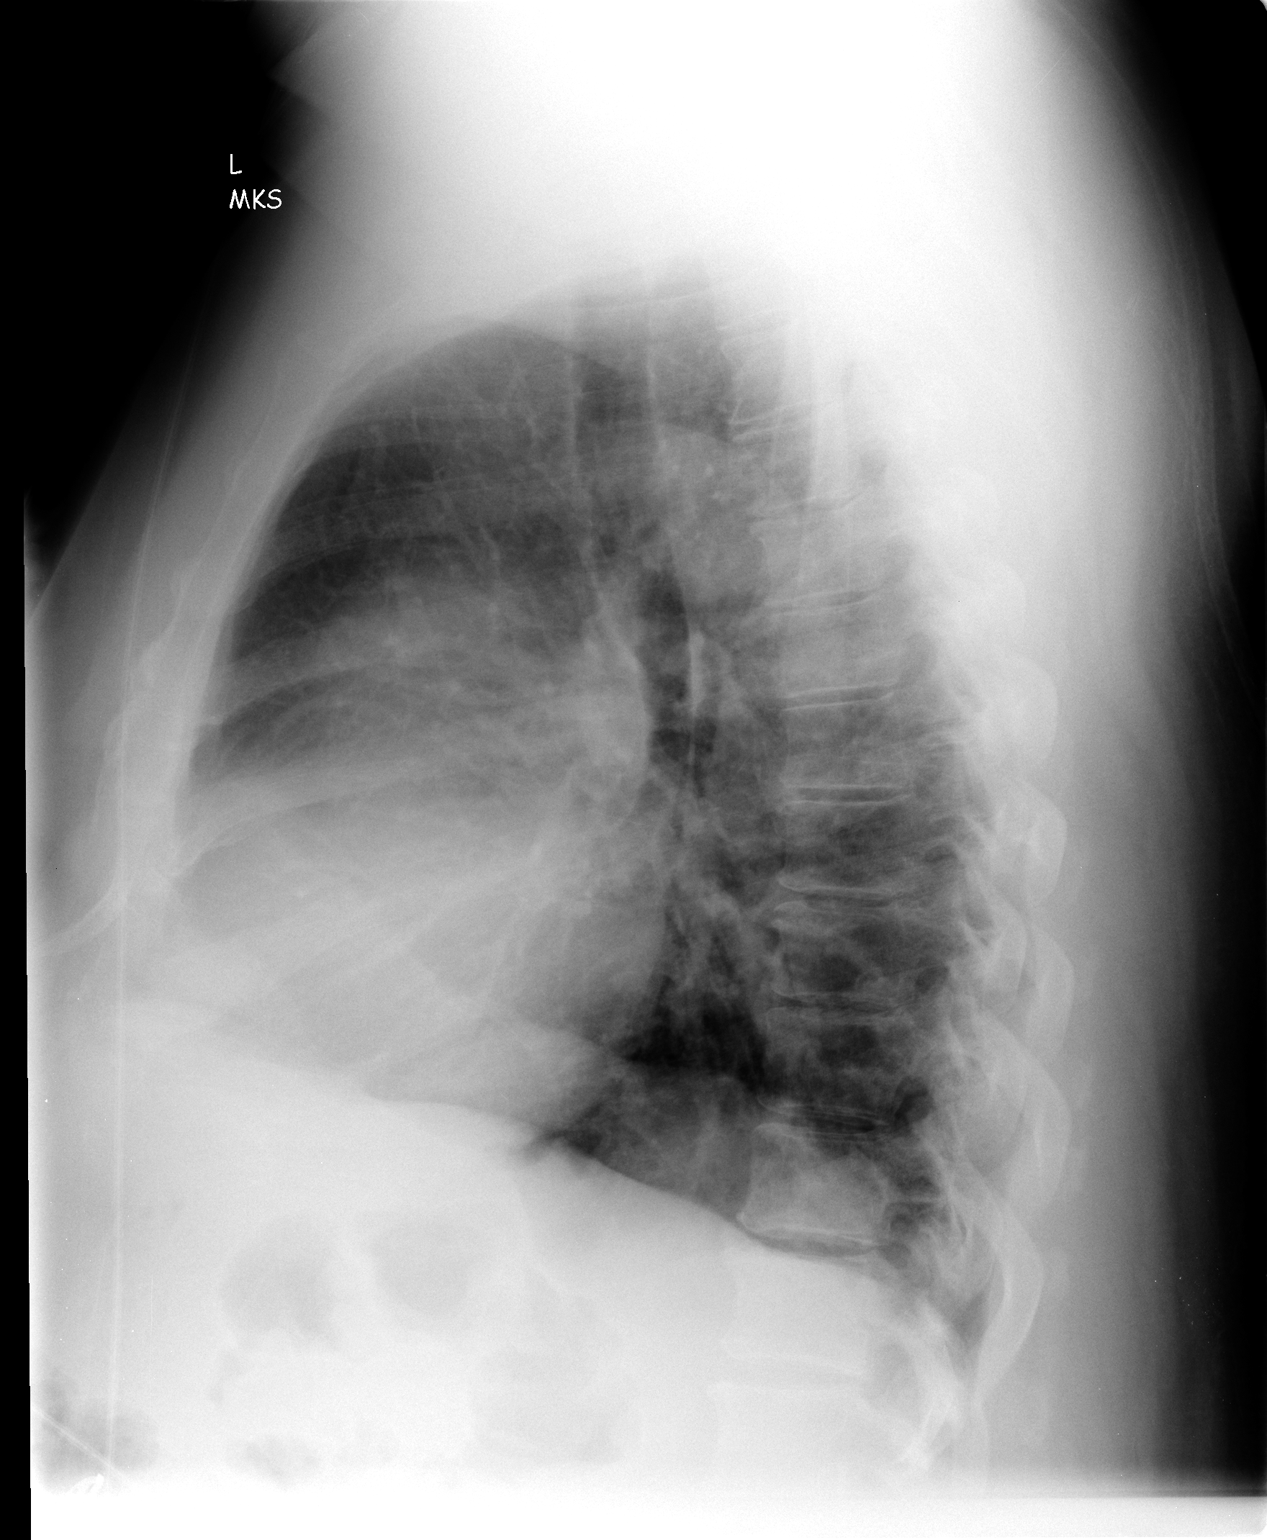

[2 of 2 positions shown; findings below may reference images not displayed]

FINDINGS: There are low lung volumes.  The heart size and
mediastinal contours are normal.  Patchy opacities in both lung
bases are most consistent with atelectasis.  There is no
consolidation, edema or pleural effusion.  No acute osseous
findings are identified.
IMPRESSION: Patchy basilar opacities most consistent with atelectasis.  No
consolidation.

## 2012-12-10 ENCOUNTER — Emergency Department (HOSPITAL_COMMUNITY)
Admission: EM | Admit: 2012-12-10 | Discharge: 2012-12-10 | Disposition: A | Discharge status: Home / Self Care | Payer: Medicare Other | Attending: Emergency Medicine | Admitting: Emergency Medicine

## 2012-12-10 ENCOUNTER — Emergency Department (HOSPITAL_COMMUNITY): Payer: Medicare Other

## 2012-12-10 ENCOUNTER — Encounter (HOSPITAL_COMMUNITY): Payer: Self-pay | Admitting: Emergency Medicine

## 2012-12-10 DIAGNOSIS — Z8619 Personal history of other infectious and parasitic diseases: Secondary | ICD-10-CM | POA: Insufficient documentation

## 2012-12-10 DIAGNOSIS — I10 Essential (primary) hypertension: Secondary | ICD-10-CM | POA: Insufficient documentation

## 2012-12-10 DIAGNOSIS — IMO0002 Reserved for concepts with insufficient information to code with codable children: Secondary | ICD-10-CM | POA: Insufficient documentation

## 2012-12-10 DIAGNOSIS — Y92009 Unspecified place in unspecified non-institutional (private) residence as the place of occurrence of the external cause: Secondary | ICD-10-CM | POA: Insufficient documentation

## 2012-12-10 DIAGNOSIS — Z8739 Personal history of other diseases of the musculoskeletal system and connective tissue: Secondary | ICD-10-CM | POA: Insufficient documentation

## 2012-12-10 DIAGNOSIS — E119 Type 2 diabetes mellitus without complications: Secondary | ICD-10-CM | POA: Insufficient documentation

## 2012-12-10 DIAGNOSIS — Y9389 Activity, other specified: Secondary | ICD-10-CM | POA: Insufficient documentation

## 2012-12-10 DIAGNOSIS — G4733 Obstructive sleep apnea (adult) (pediatric): Secondary | ICD-10-CM | POA: Insufficient documentation

## 2012-12-10 DIAGNOSIS — S79919A Unspecified injury of unspecified hip, initial encounter: Secondary | ICD-10-CM | POA: Insufficient documentation

## 2012-12-10 DIAGNOSIS — Z87891 Personal history of nicotine dependence: Secondary | ICD-10-CM | POA: Insufficient documentation

## 2012-12-10 DIAGNOSIS — X503XXA Overexertion from repetitive movements, initial encounter: Secondary | ICD-10-CM | POA: Insufficient documentation

## 2012-12-10 DIAGNOSIS — M549 Dorsalgia, unspecified: Secondary | ICD-10-CM

## 2012-12-10 DIAGNOSIS — Z862 Personal history of diseases of the blood and blood-forming organs and certain disorders involving the immune mechanism: Secondary | ICD-10-CM | POA: Insufficient documentation

## 2012-12-10 DIAGNOSIS — M25551 Pain in right hip: Secondary | ICD-10-CM

## 2012-12-10 DIAGNOSIS — J4489 Other specified chronic obstructive pulmonary disease: Secondary | ICD-10-CM | POA: Insufficient documentation

## 2012-12-10 DIAGNOSIS — J449 Chronic obstructive pulmonary disease, unspecified: Secondary | ICD-10-CM | POA: Insufficient documentation

## 2012-12-10 DIAGNOSIS — Z79899 Other long term (current) drug therapy: Secondary | ICD-10-CM | POA: Insufficient documentation

## 2012-12-10 DIAGNOSIS — W108XXA Fall (on) (from) other stairs and steps, initial encounter: Secondary | ICD-10-CM | POA: Insufficient documentation

## 2012-12-10 MED ORDER — CYCLOBENZAPRINE HCL 10 MG PO TABS: 10.0000 mg | ORAL_TABLET | Freq: Two times a day (BID) | ORAL | Status: DC | PRN

## 2012-12-10 MED ORDER — HYDROMORPHONE HCL PF 1 MG/ML IJ SOLN
1.0000 mg | Freq: Once | INTRAMUSCULAR | Status: AC
  Administered 2012-12-10: 1 mg via INTRAVENOUS
  Filled 2012-12-10: qty 1

## 2012-12-10 MED ORDER — HYDROCODONE-ACETAMINOPHEN 5-325 MG PO TABS: 1.0000 | ORAL_TABLET | Freq: Four times a day (QID) | ORAL | Status: DC | PRN

## 2012-12-10 MED ORDER — OXYCODONE-ACETAMINOPHEN 5-325 MG PO TABS
1.0000 | ORAL_TABLET | Freq: Once | ORAL | Status: AC
  Administered 2012-12-10: 1 via ORAL
  Filled 2012-12-10: qty 1

## 2012-12-10 MED ORDER — ONDANSETRON HCL 4 MG/2ML IJ SOLN
4.0000 mg | Freq: Once | INTRAMUSCULAR | Status: AC
  Administered 2012-12-10: 4 mg via INTRAVENOUS
  Filled 2012-12-10: qty 2

## 2012-12-10 NOTE — ED Notes (Signed)
Pt reports falling out of bed five days ago. States pain has become progressively worse and states he is unable to bear weight on the right leg.

## 2012-12-10 NOTE — ED Notes (Signed)
Per tech: Pt ambulated a few steps but did not want to go further as pain was too severe.

## 2012-12-10 NOTE — ED Provider Notes (Signed)
History     CSN: [id number]  Arrival date & time 12/10/12  0308   First MD Initiated Contact with Patient 12/10/12 0319      Chief Complaint  Patient presents with  . Hip Pain    (Consider location/radiation/quality/duration/timing/severity/associated sxs/prior treatment) HPI Hx per PT  Larey Seat out of bed 5 days ago onto R side, since then having R hip pain mild in severity Tonight was in his garden, had bent over and then developed more severe pain radiating to RLE, now unable to bear weight due to the pain. No incontinence, no weakness/ numbness. No h/o same. No F/C. Pain is positional, worse with movement.   Past Medical History  Diagnosis Date  . COPD (chronic obstructive pulmonary disease)     FEV1 1.9  . Essential hypertension, benign   . Anemia   . Type 2 diabetes mellitus   . OSA (obstructive sleep apnea)     CPAP  . Hepatitis C   . Bursitis     Shoulder, with adhesive capsulitis  . Pulmonary hypertension     Secondary    Past Surgical History  Procedure Laterality Date  . Transurethral resection of prostate      Family History  Problem Relation Age of Onset  . Throat cancer Father   . Diabetes      History  Substance Use Topics  . Smoking status: Former Smoker    Types: Cigarettes    Quit date: 06/13/2006  . Smokeless tobacco: Never Used  . Alcohol Use: No      Review of Systems  Constitutional: Negative for fever and chills.  HENT: Negative for neck pain and neck stiffness.   Eyes: Negative for pain.  Respiratory: Negative for shortness of breath.   Cardiovascular: Negative for chest pain.  Gastrointestinal: Negative for abdominal pain.  Genitourinary: Negative for dysuria.  Musculoskeletal: Positive for back pain.  Skin: Negative for rash.  Neurological: Negative for headaches.  All other systems reviewed and are negative.    Allergies  Review of patient's allergies indicates no known allergies.  Home Medications   Current  Outpatient Rx  Name  Route  Sig  Dispense  Refill  . ammonium lactate (LAC-HYDRIN) 12 % lotion   Topical   Apply 1 application topically 2 (two) times daily.           . budesonide-formoterol (SYMBICORT) 160-4.5 MCG/ACT inhaler   Inhalation   Inhale 2 puffs into the lungs 2 (two) times daily.           . capsaicin (ZOSTRIX) 0.025 % cream   Topical   Apply topically 3 (three) times daily.           . Cetirizine HCl 10 MG CAPS   Oral   Take 1 capsule by mouth daily.           . fluticasone (FLONASE) 50 MCG/ACT nasal spray   Nasal   Place 2 sprays into the nose daily.           . formoterol (FORADIL) 12 MCG capsule for inhaler   Inhalation   Place 12 mcg into inhaler and inhale 2 (two) times daily.           Marland Kitchen glyBURIDE (DIABETA) 5 MG tablet   Oral   Take 10 mg by mouth 2 (two) times daily with a meal.           . hydrochlorothiazide 25 MG tablet   Oral   Take 12.5 mg by  mouth daily.           Marland Kitchen HYDROcodone-acetaminophen (VICODIN) 5-500 MG per tablet   Oral   Take 1 tablet by mouth every 6 (six) hours as needed.           . hydrocortisone 2.5 % cream   Topical   Apply 1 application topically 2 (two) times daily.           Marland Kitchen EXPIRED: lisinopril (PRINIVIL,ZESTRIL) 20 MG tablet   Oral   Take 0.5 tablets (10 mg total) by mouth daily.   30 tablet   0   . losartan (COZAAR) 25 MG tablet   Oral   Take 12.5 mg by mouth daily.           . metFORMIN (GLUCOPHAGE) 1000 MG tablet      Take 1 tablet in the AM and 1.5 tablet in the PM          . mometasone (ASMANEX) 220 MCG/INH inhaler   Inhalation   Inhale 2 puffs into the lungs daily.           . Omega-3 Fatty Acids (FISH OIL) 1000 MG CAPS   Oral   Take 2 capsules by mouth 3 (three) times daily.           Marland Kitchen omeprazole (PRILOSEC) 20 MG capsule   Oral   Take 20 mg by mouth daily.           . pramipexole (MIRAPEX) 0.125 MG tablet   Oral   Take 0.125 mg by mouth at bedtime.            . simvastatin (ZOCOR) 80 MG tablet   Oral   Take 80 mg by mouth at bedtime.           . topiramate (TOPAMAX) 25 MG tablet   Oral   Take 25 mg by mouth 2 (two) times daily.             BP 153/80  Temp(Src) 99.3 F (37.4 C) (Oral)  Resp 18  Ht 5\' 11"  (1.803 m)  Wt 254 lb (115.214 kg)  BMI 35.44 kg/m2  SpO2 95%  Physical Exam  Constitutional: He is oriented to person, place, and time. He appears well-developed and well-nourished.  HENT:  Head: Normocephalic and atraumatic.  Eyes: EOM are normal. Pupils are equal, round, and reactive to light.  Neck: Neck supple.  Cardiovascular: Normal rate, regular rhythm and intact distal pulses.   Pulmonary/Chest: Effort normal and breath sounds normal. No respiratory distress.  Abdominal: Soft. Bowel sounds are normal. He exhibits no distension. There is no tenderness. There is no rebound and no guarding.  Musculoskeletal: Normal range of motion. He exhibits no edema.  Tender over R lower paralumbar region and somewhat over R sciatic region. No LE deficits with equal DTRs, motor strengths and sensorium to light touch. Pelvis stable, localizes pain to R hip but no LE deformity.  Neurological: He is alert and oriented to person, place, and time.  Skin: Skin is warm and dry.    ED Course  Procedures (including critical care time)  Dg Lumbar Spine Complete  12/10/2012   *RADIOLOGY REPORT*  Clinical Data: Lower back and right hip pain after falling out of bed.  LUMBAR SPINE - COMPLETE 4+ VIEW  Comparison: None.  Findings: There is no evidence of fracture or subluxation. Vertebral bodies demonstrate normal height and alignment. Intervertebral disc spaces are preserved.  The visualized neural foramina are grossly unremarkable in appearance.  The visualized bowel gas pattern is unremarkable in appearance; air and stool are noted within the colon.  The sacroiliac joints are within normal limits.  Scattered vascular calcifications are seen.   IMPRESSION:  1.  No evidence of fracture or subluxation along the lumbar spine. 2.  Scattered vascular calcifications seen.   Original Report Authenticated By: Tonia Ghent, M.D.    IV Dilaudid, zofran and ice provided  On recheck , pain improved, ambulates in ED, no deficits. Plan Rx, will treat for MSK pain, back pain precautions and f/u PCP.    MDM  R hip and LBP  Xray reviewed Improved with IV narcotics VS/ nurses notes reviewed        Sunnie Nielsen, MD 12/10/12 [phone]

## 2014-09-08 ENCOUNTER — Emergency Department (HOSPITAL_COMMUNITY): Payer: Non-veteran care

## 2014-09-08 ENCOUNTER — Inpatient Hospital Stay (HOSPITAL_COMMUNITY)
Admission: EM | Admit: 2014-09-08 | Discharge: 2014-09-10 | DRG: 190 | Disposition: A | Discharge status: Home / Self Care | Payer: Non-veteran care | Attending: Internal Medicine | Admitting: Internal Medicine

## 2014-09-08 ENCOUNTER — Encounter (HOSPITAL_COMMUNITY): Payer: Self-pay | Admitting: Emergency Medicine

## 2014-09-08 DIAGNOSIS — J9621 Acute and chronic respiratory failure with hypoxia: Secondary | ICD-10-CM | POA: Diagnosis present

## 2014-09-08 DIAGNOSIS — Z7951 Long term (current) use of inhaled steroids: Secondary | ICD-10-CM

## 2014-09-08 DIAGNOSIS — N179 Acute kidney failure, unspecified: Secondary | ICD-10-CM | POA: Diagnosis present

## 2014-09-08 DIAGNOSIS — Z9981 Dependence on supplemental oxygen: Secondary | ICD-10-CM

## 2014-09-08 DIAGNOSIS — J441 Chronic obstructive pulmonary disease with (acute) exacerbation: Secondary | ICD-10-CM | POA: Diagnosis not present

## 2014-09-08 DIAGNOSIS — E1165 Type 2 diabetes mellitus with hyperglycemia: Secondary | ICD-10-CM | POA: Diagnosis present

## 2014-09-08 DIAGNOSIS — R0602 Shortness of breath: Secondary | ICD-10-CM | POA: Diagnosis not present

## 2014-09-08 DIAGNOSIS — Z833 Family history of diabetes mellitus: Secondary | ICD-10-CM

## 2014-09-08 DIAGNOSIS — J449 Chronic obstructive pulmonary disease, unspecified: Secondary | ICD-10-CM

## 2014-09-08 DIAGNOSIS — G4733 Obstructive sleep apnea (adult) (pediatric): Secondary | ICD-10-CM | POA: Diagnosis present

## 2014-09-08 DIAGNOSIS — I509 Heart failure, unspecified: Secondary | ICD-10-CM

## 2014-09-08 DIAGNOSIS — I10 Essential (primary) hypertension: Secondary | ICD-10-CM | POA: Diagnosis present

## 2014-09-08 DIAGNOSIS — Z6832 Body mass index (BMI) 32.0-32.9, adult: Secondary | ICD-10-CM

## 2014-09-08 DIAGNOSIS — Z808 Family history of malignant neoplasm of other organs or systems: Secondary | ICD-10-CM

## 2014-09-08 DIAGNOSIS — I272 Other secondary pulmonary hypertension: Secondary | ICD-10-CM | POA: Diagnosis present

## 2014-09-08 DIAGNOSIS — J9611 Chronic respiratory failure with hypoxia: Secondary | ICD-10-CM | POA: Diagnosis present

## 2014-09-08 DIAGNOSIS — Z87891 Personal history of nicotine dependence: Secondary | ICD-10-CM

## 2014-09-08 DIAGNOSIS — Z7982 Long term (current) use of aspirin: Secondary | ICD-10-CM

## 2014-09-08 DIAGNOSIS — I5033 Acute on chronic diastolic (congestive) heart failure: Secondary | ICD-10-CM | POA: Diagnosis present

## 2014-09-08 LAB — GLUCOSE, CAPILLARY: Glucose-Capillary: 271 mg/dL — ABNORMAL HIGH (ref 70–99)

## 2014-09-08 LAB — CBC
HEMATOCRIT: 37.7 % — AB (ref 39.0–52.0)
Hemoglobin: 11.4 g/dL — ABNORMAL LOW (ref 13.0–17.0)
MCH: 22.7 pg — ABNORMAL LOW (ref 26.0–34.0)
MCHC: 30.2 g/dL (ref 30.0–36.0)
MCV: 75 fL — ABNORMAL LOW (ref 78.0–100.0)
PLATELETS: 166 10*3/uL (ref 150–400)
RBC: 5.03 MIL/uL (ref 4.22–5.81)
RDW: 22.2 % — AB (ref 11.5–15.5)
WBC: 7.4 10*3/uL (ref 4.0–10.5)

## 2014-09-08 LAB — BASIC METABOLIC PANEL
ANION GAP: 2 — AB (ref 5–15)
BUN: 38 mg/dL — ABNORMAL HIGH (ref 6–23)
CHLORIDE: 105 mmol/L (ref 96–112)
CO2: 28 mmol/L (ref 19–32)
Calcium: 8.2 mg/dL — ABNORMAL LOW (ref 8.4–10.5)
Creatinine, Ser: 1.83 mg/dL — ABNORMAL HIGH (ref 0.50–1.35)
GFR calc non Af Amer: 37 mL/min — ABNORMAL LOW (ref >90)
GFR, EST AFRICAN AMERICAN: 43 mL/min — AB (ref >90)
Glucose, Bld: 341 mg/dL — ABNORMAL HIGH (ref 70–99)
POTASSIUM: 4 mmol/L (ref 3.5–5.1)
Sodium: 135 mmol/L (ref 135–145)

## 2014-09-08 LAB — BRAIN NATRIURETIC PEPTIDE: B NATRIURETIC PEPTIDE 5: 312 pg/mL — AB (ref 0.0–100.0)

## 2014-09-08 LAB — TROPONIN I: Troponin I: <0.03 ng/mL (ref <0.031)

## 2014-09-08 LAB — CBG MONITORING, ED: Glucose-Capillary: 251 mg/dL — ABNORMAL HIGH (ref 70–99)

## 2014-09-08 MED ORDER — ACETAMINOPHEN 325 MG PO TABS: 650.0000 mg | ORAL_TABLET | ORAL | Status: DC | PRN

## 2014-09-08 MED ORDER — AMLODIPINE BESYLATE 5 MG PO TABS: 10.0000 mg | ORAL_TABLET | Freq: Every day | ORAL | Status: DC

## 2014-09-08 MED ORDER — LORATADINE 10 MG PO TABS
10.0000 mg | ORAL_TABLET | Freq: Every day | ORAL | Status: DC
  Administered 2014-09-08 – 2014-09-10 (×3): 10 mg via ORAL
  Filled 2014-09-08 (×3): qty 1

## 2014-09-08 MED ORDER — PRAMIPEXOLE DIHYDROCHLORIDE 0.25 MG PO TABS
0.1250 mg | ORAL_TABLET | Freq: Every day | ORAL | Status: DC
  Administered 2014-09-09: 0.125 mg via ORAL
  Filled 2014-09-08 (×3): qty 1

## 2014-09-08 MED ORDER — FUROSEMIDE 10 MG/ML IJ SOLN
20.0000 mg | Freq: Once | INTRAMUSCULAR | Status: AC
  Administered 2014-09-08: 20 mg via INTRAVENOUS
  Filled 2014-09-08: qty 2

## 2014-09-08 MED ORDER — PREDNISONE 50 MG PO TABS
60.0000 mg | ORAL_TABLET | Freq: Once | ORAL | Status: AC
  Administered 2014-09-08: 60 mg via ORAL
  Filled 2014-09-08 (×2): qty 1

## 2014-09-08 MED ORDER — INSULIN ASPART 100 UNIT/ML ~~LOC~~ SOLN
0.0000 [IU] | Freq: Every day | SUBCUTANEOUS | Status: DC
  Administered 2014-09-08 – 2014-09-09 (×2): 3 [IU] via SUBCUTANEOUS

## 2014-09-08 MED ORDER — SODIUM CHLORIDE 0.9 % IJ SOLN
3.0000 mL | Freq: Two times a day (BID) | INTRAMUSCULAR | Status: DC
  Administered 2014-09-08 – 2014-09-10 (×4): 3 mL via INTRAVENOUS

## 2014-09-08 MED ORDER — LEVOFLOXACIN IN D5W 500 MG/100ML IV SOLN
500.0000 mg | INTRAVENOUS | Status: DC
  Administered 2014-09-08 – 2014-09-09 (×2): 500 mg via INTRAVENOUS
  Filled 2014-09-08 (×2): qty 100

## 2014-09-08 MED ORDER — ASPIRIN 81 MG PO CHEW
324.0000 mg | CHEWABLE_TABLET | Freq: Once | ORAL | Status: AC
Start: 2014-09-08 — End: 2014-09-08
  Administered 2014-09-08: 324 mg via ORAL
  Filled 2014-09-08: qty 4

## 2014-09-08 MED ORDER — AMMONIUM LACTATE 12 % EX LOTN
1.0000 "application " | TOPICAL_LOTION | CUTANEOUS | Status: DC | PRN
  Filled 2014-09-08: qty 400

## 2014-09-08 MED ORDER — FUROSEMIDE 10 MG/ML IJ SOLN
40.0000 mg | Freq: Two times a day (BID) | INTRAMUSCULAR | Status: DC
  Administered 2014-09-08 – 2014-09-10 (×4): 40 mg via INTRAVENOUS
  Filled 2014-09-08 (×4): qty 4

## 2014-09-08 MED ORDER — ENOXAPARIN SODIUM 30 MG/0.3ML ~~LOC~~ SOLN
30.0000 mg | SUBCUTANEOUS | Status: DC
  Administered 2014-09-08 – 2014-09-09 (×2): 30 mg via SUBCUTANEOUS
  Filled 2014-09-08 (×2): qty 0.3

## 2014-09-08 MED ORDER — INSULIN ASPART 100 UNIT/ML ~~LOC~~ SOLN
0.0000 [IU] | Freq: Three times a day (TID) | SUBCUTANEOUS | Status: DC
  Administered 2014-09-08: 8 [IU] via SUBCUTANEOUS
  Administered 2014-09-09: 15 [IU] via SUBCUTANEOUS
  Administered 2014-09-09 (×2): 5 [IU] via SUBCUTANEOUS
  Administered 2014-09-10: 8 [IU] via SUBCUTANEOUS
  Administered 2014-09-10: 3 [IU] via SUBCUTANEOUS
  Filled 2014-09-08: qty 1

## 2014-09-08 MED ORDER — ATORVASTATIN CALCIUM 40 MG PO TABS
40.0000 mg | ORAL_TABLET | Freq: Every day | ORAL | Status: DC
  Administered 2014-09-08 – 2014-09-09 (×2): 40 mg via ORAL
  Filled 2014-09-08 (×2): qty 1

## 2014-09-08 MED ORDER — FLUTICASONE PROPIONATE 50 MCG/ACT NA SUSP
2.0000 | Freq: Two times a day (BID) | NASAL | Status: DC
  Administered 2014-09-08 – 2014-09-10 (×4): 2 via NASAL
  Filled 2014-09-08 (×2): qty 16

## 2014-09-08 MED ORDER — ASPIRIN 81 MG PO CHEW
81.0000 mg | CHEWABLE_TABLET | Freq: Every morning | ORAL | Status: DC
  Administered 2014-09-09 – 2014-09-10 (×2): 81 mg via ORAL
  Filled 2014-09-08 (×2): qty 1

## 2014-09-08 MED ORDER — PANTOPRAZOLE SODIUM 40 MG PO TBEC
40.0000 mg | DELAYED_RELEASE_TABLET | Freq: Every day | ORAL | Status: DC
  Administered 2014-09-08 – 2014-09-10 (×3): 40 mg via ORAL
  Filled 2014-09-08 (×3): qty 1

## 2014-09-08 MED ORDER — BUDESONIDE-FORMOTEROL FUMARATE 160-4.5 MCG/ACT IN AERO
INHALATION_SPRAY | RESPIRATORY_TRACT | Status: AC
  Filled 2014-09-08: qty 6

## 2014-09-08 MED ORDER — AMLODIPINE BESYLATE 5 MG PO TABS
10.0000 mg | ORAL_TABLET | Freq: Every day | ORAL | Status: DC
  Administered 2014-09-09 – 2014-09-10 (×2): 10 mg via ORAL
  Filled 2014-09-08 (×2): qty 2

## 2014-09-08 MED ORDER — ONDANSETRON HCL 4 MG/2ML IJ SOLN: 4.0000 mg | Freq: Four times a day (QID) | INTRAMUSCULAR | Status: DC | PRN

## 2014-09-08 MED ORDER — IPRATROPIUM-ALBUTEROL 0.5-2.5 (3) MG/3ML IN SOLN
3.0000 mL | Freq: Once | RESPIRATORY_TRACT | Status: AC
  Administered 2014-09-08: 3 mL via RESPIRATORY_TRACT
  Filled 2014-09-08: qty 3

## 2014-09-08 MED ORDER — METHYLPREDNISOLONE SODIUM SUCC 125 MG IJ SOLR
80.0000 mg | Freq: Two times a day (BID) | INTRAMUSCULAR | Status: DC
  Administered 2014-09-08 – 2014-09-09 (×2): 80 mg via INTRAVENOUS
  Filled 2014-09-08 (×2): qty 2

## 2014-09-08 MED ORDER — METHYLPREDNISOLONE SODIUM SUCC 125 MG IJ SOLR: 80.0000 mg | Freq: Two times a day (BID) | INTRAMUSCULAR | Status: DC

## 2014-09-08 MED ORDER — TOPIRAMATE 25 MG PO TABS
ORAL_TABLET | ORAL | Status: AC
  Filled 2014-09-08: qty 1

## 2014-09-08 MED ORDER — BUDESONIDE-FORMOTEROL FUMARATE 160-4.5 MCG/ACT IN AERO
2.0000 | INHALATION_SPRAY | Freq: Two times a day (BID) | RESPIRATORY_TRACT | Status: DC
  Administered 2014-09-08 – 2014-09-10 (×4): 2 via RESPIRATORY_TRACT
  Filled 2014-09-08: qty 6

## 2014-09-08 MED ORDER — TOPIRAMATE 25 MG PO TABS
25.0000 mg | ORAL_TABLET | Freq: Two times a day (BID) | ORAL | Status: DC
  Administered 2014-09-08 – 2014-09-10 (×4): 25 mg via ORAL
  Filled 2014-09-08 (×6): qty 1

## 2014-09-08 MED ORDER — METOPROLOL SUCCINATE ER 25 MG PO TB24
ORAL_TABLET | ORAL | Status: AC
  Filled 2014-09-08: qty 1

## 2014-09-08 MED ORDER — GLYBURIDE 5 MG PO TABS
10.0000 mg | ORAL_TABLET | Freq: Two times a day (BID) | ORAL | Status: DC
  Administered 2014-09-09 – 2014-09-10 (×3): 10 mg via ORAL
  Filled 2014-09-08 (×3): qty 2

## 2014-09-08 NOTE — H&P (Addendum)
[facility] History and Physical  Bradley Fuller NFA:[id number] DOB: [birth year] DOA: 09/08/2014  Referring physician: ER PCP: Juliette Alcide, MD   Chief Complaint: Dyspnea, productive cough.  HPI: Bradley Fuller is a 64 y.o. male  This is a 64 year old veteran who has a history of COPD and hepatitis C and who presents to the emergency room with a three-day history of increasing dyspnea and associated with a cough productive of yellowish sputum. He denies any fever. He is requiring increasing supplemental oxygen for the last couple weeks, more so in the last couple of days. He denies any chest pain, nausea, vomiting. He does describe chills. He has had somewhat swelling of his lower legs. He is diabetic and hypertensive. He has a history of obstructive sleep apnea and uses C Pap at night. Evaluation in the emergency room is suggestive of congestive heart failure. He is now being admitted for further management.   Review of Systems:  Apart from symptoms above, all systems negative.  Past Medical History  Diagnosis Date  . COPD (chronic obstructive pulmonary disease)     FEV1 1.9  . Essential hypertension, benign   . Anemia   . Type 2 diabetes mellitus   . OSA (obstructive sleep apnea)     CPAP  . Hepatitis C   . Bursitis     Shoulder, with adhesive capsulitis  . Pulmonary hypertension     Secondary   Past Surgical History  Procedure Laterality Date  . Transurethral resection of prostate     Social History:  reports that he quit smoking about 8 years ago. His smoking use included Cigarettes. He has never used smokeless tobacco. He reports that he does not drink alcohol or use illicit drugs.  Allergies  Allergen Reactions  . Lisinopril Swelling    Tongue swell.    Family History  Problem Relation Age of Onset  . Throat cancer Father   . Diabetes      Prior to Admission medications   Medication Sig Start Date End Date Taking? Authorizing Provider  amLODipine  (NORVASC) 10 MG tablet Take 10 mg by mouth daily.   Yes Historical Provider, MD  ammonium lactate (LAC-HYDRIN) 12 % lotion Apply 1 application topically as needed for dry skin.    Yes Historical Provider, MD  aspirin 81 MG tablet Take 81 mg by mouth every morning.    Yes Historical Provider, MD  atorvastatin (LIPITOR) 80 MG tablet Take 40 mg by mouth at bedtime.   Yes Historical Provider, MD  budesonide-formoterol (SYMBICORT) 160-4.5 MCG/ACT inhaler Inhale 2 puffs into the lungs 2 (two) times daily.     Yes Historical Provider, MD  capsaicin (ZOSTRIX) 0.025 % cream Apply 1 application topically daily as needed.    Yes Historical Provider, MD  cetirizine (ZYRTEC) 10 MG tablet Take 10 mg by mouth at bedtime.   Yes Historical Provider, MD  fluticasone (FLONASE) 50 MCG/ACT nasal spray Place 2 sprays into the nose 2 (two) times daily.    Yes Historical Provider, MD  glyBURIDE (DIABETA) 5 MG tablet Take 10 mg by mouth 2 (two) times daily with a meal.     Yes Historical Provider, MD  omeprazole (PRILOSEC) 20 MG capsule Take 20 mg by mouth daily.     Yes Historical Provider, MD  pramipexole (MIRAPEX) 0.125 MG tablet Take 0.125 mg by mouth at bedtime.     Yes Historical Provider, MD  sertraline (ZOLOFT) 100 MG tablet Take 50 mg by mouth at bedtime as  needed.   Yes Historical Provider, MD  topiramate (TOPAMAX) 25 MG tablet Take 25 mg by mouth 2 (two) times daily.     Yes Historical Provider, MD  cyclobenzaprine (FLEXERIL) 10 MG tablet Take 1 tablet (10 mg total) by mouth 2 (two) times daily as needed for muscle spasms. Patient not taking: Reported on 09/08/2014 12/10/12   Sunnie NielsenBrian Opitz, MD  HYDROcodone-acetaminophen (NORCO/VICODIN) 5-325 MG per tablet Take 1 tablet by mouth every 6 (six) hours as needed for pain. Patient not taking: Reported on 09/08/2014 12/10/12   Sunnie NielsenBrian Opitz, MD   Physical Exam: Filed Vitals:   09/08/14 1638 09/08/14 1646 09/08/14 1717 09/08/14 1730  BP: 106/73 106/73 124/78 130/88  Pulse:   84 77 69  Temp:      TempSrc:      Resp: 16 16 16    Height:      Weight:      SpO2:  94% 98% 93%    Wt Readings from Last 3 Encounters:  09/08/14 104.146 kg (229 lb 9.6 oz)  12/10/12 115.214 kg (254 lb)  03/23/11 119.296 kg (263 lb)    General:  Appears calm and comfortable at rest. No increased work of breathing at rest. No peripheral or central cyanosis.  Eyes: PERRL, normal lids, irises & conjunctiva ENT: grossly normal hearing, lips & tongue Neck: no LAD, masses or thyromegaly Cardiovascular: RRR, nHe does have peripheral pitting edema in his lower legs.  Telemetry: SR, no arrhythmias  Respiratory :bilateral wheezing with inspiratory basal crackles. No bronchial breathing. Abdomen: soft, ntnd Skin: no rash or induration seen on limited exam Musculoskeletal: grossly normal tone BUE/BLE Psychiatric: grossly normal mood and affect, speech fluent and appropriate Neurologic: grossly non-focal.          Labs on Admission:  Basic Metabolic Panel:  Recent Labs Lab 09/08/14 1421  NA 135  K 4.0  CL 105  CO2 28  GLUCOSE 341*  BUN 38*  CREATININE 1.83*  CALCIUM 8.2*   Liver Function Tests: No results for input(s): AST, ALT, ALKPHOS, BILITOT, PROT, ALBUMIN in the last 168 hours. No results for input(s): LIPASE, AMYLASE in the last 168 hours. No results for input(s): AMMONIA in the last 168 hours. CBC:  Recent Labs Lab 09/08/14 1421  WBC 7.4  HGB 11.4*  HCT 37.7*  MCV 75.0*  PLT 166   Cardiac Enzymes:  Recent Labs Lab 09/08/14 1421  TROPONINI <0.03    BNP (last 3 results)  Recent Labs  09/08/14 1421  BNP 312.0*    ProBNP (last 3 results) No results for input(s): PROBNP in the last 8760 hours.  CBG: No results for input(s): GLUCAP in the last 168 hours.  Radiological Exams on Admission: Dg Chest 2 View (if Patient Has Fever And/or Copd)  09/08/2014   CLINICAL DATA:  Cough  EXAM: CHEST  2 VIEW  COMPARISON:  05/20/2010  FINDINGS: Cardiac  enlargement. Pulmonary artery enlargement consistent with pulmonary artery hypertension. Mild venous congestion and early edema also present. Negative for pleural effusion.  IMPRESSION: Pulmonary artery hypertension with mild heart failure.   Electronically Signed   By: Marlan Palauharles  Clark M.D.   On: 09/08/2014 13:55    EKG:  normal sinus rhythm. ST changes inferiorly but no ST elevation. These seem to represent chronic changes.  Assessment/Plan   1. CHF.chest x-ray findings and history appear to support this. He will be treated with intravenous Lasix. We will obtain echocardiogram and cardiology consultation. We will cycle serial troponin levels. 2.  COPD exacerbation .He will be treated with intravenous steroids and antibiotics. 3. Hypertension, stable. 4. Morbid obesity. 5.Obstructive sleep apnea. He will continue with C Pap machine.  6. Diabetes mellitus. Continue with home medications and sliding scale insulin.  Further recommendations will depend on patient's hospital progress.  Code Status: Full code  DVT Prophylaxis:  Lovenox   Family Communication:  I discussed the plan with the patient at the bedside.   Disposition Plan:  home when medically stable  Time 60 minutes.  Wilson Singer [facility] Pager [phone]

## 2014-09-08 NOTE — ED Notes (Signed)
Ambulated patient around unit.  Placed on O2 @8L  while ambulating. Heart rate in mid 80's, O2 83%. Denies SOB. Returned to room, patient placed on O2 @3L .  Patient stated when at home he ambulates on 8L of O2 and 3L while resting.

## 2014-09-08 NOTE — ED Provider Notes (Signed)
CSN: [id number]     Arrival date & time 09/08/14  1316 History  This chart was scribed for Glynn OctaveStephen Loron Weimer, MD by Luisa DagoPriscilla Tutu, ED Scribe. This patient was seen in room APA11/APA11 and the patient's care was started at 2:07 PM.     Chief Complaint  Patient presents with  . Shortness of Breath   The history is provided by the patient and medical records. No language interpreter was used.   HPI Comments: Bradley Fuller is a 64 y.o. male with PMhx of COPD and Hep C presents to the Emergency Department complaining of gradual onset persistent SOB for approximately 5 days. Pt states that he has also been sick with cold like symptoms for the past 3 days. He endorses and associated moist cough. Denies any fever. Pt is on 8L oxygen. He states that his SOB is worsened by exertion. Endorses getting a flu vaccine this season. Denies taking any blood thinners. No back pain, abdominal pain, fever, chills, nausea, emesis, weakness, numbness, or appetite change.  Pt states that his CBG has been a bit elevated but its been coming down because he recently finished taking his prescribed course of prednisone.    Past Medical History  Diagnosis Date  . COPD (chronic obstructive pulmonary disease)     FEV1 1.9  . Essential hypertension, benign   . Anemia   . Type 2 diabetes mellitus   . OSA (obstructive sleep apnea)     CPAP  . Hepatitis C   . Bursitis     Shoulder, with adhesive capsulitis  . Pulmonary hypertension     Secondary   Past Surgical History  Procedure Laterality Date  . Transurethral resection of prostate     Family History  Problem Relation Age of Onset  . Throat cancer Father   . Diabetes     History  Substance Use Topics  . Smoking status: Former Smoker    Types: Cigarettes    Quit date: 06/13/2006  . Smokeless tobacco: Never Used  . Alcohol Use: No    Review of Systems A complete 10 system review of systems was obtained and all systems are negative except as noted in the  HPI and PMH.    Allergies  Lisinopril  Home Medications   Prior to Admission medications   Medication Sig Start Date End Date Taking? Authorizing Provider  amLODipine (NORVASC) 10 MG tablet Take 10 mg by mouth daily.   Yes Historical Provider, MD  ammonium lactate (LAC-HYDRIN) 12 % lotion Apply 1 application topically as needed for dry skin.    Yes Historical Provider, MD  aspirin 81 MG tablet Take 81 mg by mouth every morning.    Yes Historical Provider, MD  atorvastatin (LIPITOR) 80 MG tablet Take 40 mg by mouth at bedtime.   Yes Historical Provider, MD  budesonide-formoterol (SYMBICORT) 160-4.5 MCG/ACT inhaler Inhale 2 puffs into the lungs 2 (two) times daily.     Yes Historical Provider, MD  capsaicin (ZOSTRIX) 0.025 % cream Apply 1 application topically daily as needed.    Yes Historical Provider, MD  cetirizine (ZYRTEC) 10 MG tablet Take 10 mg by mouth at bedtime.   Yes Historical Provider, MD  fluticasone (FLONASE) 50 MCG/ACT nasal spray Place 2 sprays into the nose 2 (two) times daily.    Yes Historical Provider, MD  glyBURIDE (DIABETA) 5 MG tablet Take 10 mg by mouth 2 (two) times daily with a meal.     Yes Historical Provider, MD  omeprazole (PRILOSEC)  20 MG capsule Take 20 mg by mouth daily.     Yes Historical Provider, MD  OXYGEN Inhale 3-8 L into the lungs See admin instructions. 3L at rest and 8L in exertion   Yes Historical Provider, MD  pramipexole (MIRAPEX) 0.125 MG tablet Take 0.125 mg by mouth at bedtime.     Yes Historical Provider, MD  sertraline (ZOLOFT) 100 MG tablet Take 50 mg by mouth at bedtime as needed.   Yes Historical Provider, MD  topiramate (TOPAMAX) 25 MG tablet Take 25 mg by mouth 2 (two) times daily.     Yes Historical Provider, MD  cyclobenzaprine (FLEXERIL) 10 MG tablet Take 1 tablet (10 mg total) by mouth 2 (two) times daily as needed for muscle spasms. Patient not taking: Reported on 09/08/2014 12/10/12   Sunnie Nielsen, MD  HYDROcodone-acetaminophen  (NORCO/VICODIN) 5-325 MG per tablet Take 1 tablet by mouth every 6 (six) hours as needed for pain. Patient not taking: Reported on 09/08/2014 12/10/12   Sunnie Nielsen, MD   BP 122/74 mmHg  Pulse 88  Temp(Src) 97.5 F (36.4 C) (Oral)  Resp 18  Ht 5' 10.5" (1.791 m)  Wt 229 lb 9.6 oz (104.146 kg)  BMI 32.47 kg/m2  SpO2 99%  Physical Exam  Constitutional: He is oriented to person, place, and time. He appears well-developed and well-nourished. No distress.  HENT:  Head: Normocephalic and atraumatic.  Mouth/Throat: Oropharynx is clear and moist. No oropharyngeal exudate.  Eyes: Conjunctivae and EOM are normal. Pupils are equal, round, and reactive to light.  Neck: Normal range of motion. Neck supple.  No meningismus.  Cardiovascular: Normal rate, regular rhythm, normal heart sounds and intact distal pulses.   No murmur heard. Pulmonary/Chest: Effort normal and breath sounds normal. No respiratory distress.  Pt dyspneic with conversation. Fair air exchange with expiratory wheezes. Basilar crackles.   Abdominal: Soft. There is no tenderness. There is no rebound and no guarding.  Musculoskeletal: Normal range of motion. He exhibits no edema or tenderness.  No peripheral edema  Neurological: He is alert and oriented to person, place, and time. No cranial nerve deficit. He exhibits normal muscle tone. Coordination normal.  No ataxia on finger to nose bilaterally. No pronator drift. 5/5 strength throughout. CN 2-12 intact. Negative Romberg. Equal grip strength. Sensation intact. Gait is normal.   Skin: Skin is warm.  Psychiatric: He has a normal mood and affect. His behavior is normal.  Nursing note and vitals reviewed.   ED Course  Procedures (including critical care time)  DIAGNOSTIC STUDIES: Oxygen Saturation is 99% on RA, normal by my interpretation.    COORDINATION OF CARE: 2:17 PM- Pt advised of plan for treatment and pt agrees.  Labs Review Labs Reviewed  BASIC METABOLIC PANEL -  Abnormal; Notable for the following:    Glucose, Bld 341 (*)    BUN 38 (*)    Creatinine, Ser 1.83 (*)    Calcium 8.2 (*)    GFR calc non Af Amer 37 (*)    GFR calc Af Amer 43 (*)    Anion gap 2 (*)    All other components within normal limits  CBC - Abnormal; Notable for the following:    Hemoglobin 11.4 (*)    HCT 37.7 (*)    MCV 75.0 (*)    MCH 22.7 (*)    RDW 22.2 (*)    All other components within normal limits  BRAIN NATRIURETIC PEPTIDE - Abnormal; Notable for the following:  B Natriuretic Peptide 312.0 (*)    All other components within normal limits  TROPONIN I  BASIC METABOLIC PANEL  TROPONIN I  TROPONIN I  TROPONIN I    Imaging Review Dg Chest 2 View (if Patient Has Fever And/or Copd)  09/08/2014   CLINICAL DATA:  Cough  EXAM: CHEST  2 VIEW  COMPARISON:  05/20/2010  FINDINGS: Cardiac enlargement. Pulmonary artery enlargement consistent with pulmonary artery hypertension. Mild venous congestion and early edema also present. Negative for pleural effusion.  IMPRESSION: Pulmonary artery hypertension with mild heart failure.   Electronically Signed   By: Marlan Palau M.D.   On: 09/08/2014 13:55     EKG Interpretation   Date/Time:  Friday September 08 2014 13:49:55 EST Ventricular Rate:  83 PR Interval:  156 QRS Duration: 86 QT Interval:  376 QTC Calculation: 441 R Axis:   98 Text Interpretation:  Normal sinus rhythm Biatrial enlargement Possible  Right ventricular hypertrophy ST \\T \ T wave abnormality, consider inferior  ischemia ST \\T \ T wave abnormality, consider anterior ischemia Abnormal  ECG POSTERIOR EKG.  no posterior ST elevation Confirmed by Manus Gunning  MD,  Jessly Lebeck [phone]) on 09/08/2014 2:19:53 PM      MDM   Final diagnoses:  CHF exacerbation  Acute renal failure, unspecified acute renal failure type   URI symptoms for 4 days of worsening congestion, dyspnea on exertion. Patient with COPD wears 3 L at rest on 8 L with ambulation by his report. No  chest pain.  ST depressions T-wave inversion anteriorly. Change from previous. Posterior EKG shows no ST elevation. Reviewed with Dr. branch who agrees no STEMI and feels this represents right ventricular strain pattern  expiratory wheezing on exam with congestion on chest x-ray. Patient will be given nebulizers, steroids and Lasix. Hyperglycemia without DKA.  Breathing improved after IV Lasix. On ambulation, patient desaturates to mid 80s on his 3 liters of O2 which is his baseline. No chest pain.   Suspect  CHF exacerbation. Will admit due to worsening O2 requirement and dyspnea on exertion. D/w Dr. Karilyn Cota.  I personally performed the services described in this documentation, which was scribed in my presence. The recorded information has been reviewed and is accurate.    Glynn Octave, MD 09/08/14 [phone]

## 2014-09-08 NOTE — Progress Notes (Addendum)
Asked to evaluate EKG on patient, question about possible posterior MI. EKG shows RAD, RAE, TWI inverions anterior leads. Extended EKG without ST elevation in posterior leads, no inferior ST/T changes. Collection of findings most consistent with RV strain pattern, though can consider anterior subendocardial ischemia as well. Patient with no chest pain.  Would suggest cycling EKG and enzymes, echocardiogram. May consult cardiology if needed.   Dominga FerryJ Javeon Macmurray MD

## 2014-09-08 NOTE — ED Notes (Signed)
edp given intial EKG. EDP reported wanted posterior EKG performed. Pt returned from xray. Posterior ekg completed. EDP at bedside in triage.

## 2014-09-08 NOTE — ED Notes (Addendum)
Pt reports cough and congestion x4 days. Mild dyspnea noted with exertion. Pt currently wearing [place]. Pt reports usually 3liters  at rest, 8 liters with ambulation. Pt reports has had to increased at rest oxygen to 5 liters. Pt denies any cp.

## 2014-09-09 DIAGNOSIS — Z833 Family history of diabetes mellitus: Secondary | ICD-10-CM | POA: Diagnosis not present

## 2014-09-09 DIAGNOSIS — J9611 Chronic respiratory failure with hypoxia: Secondary | ICD-10-CM | POA: Diagnosis present

## 2014-09-09 DIAGNOSIS — I5033 Acute on chronic diastolic (congestive) heart failure: Secondary | ICD-10-CM | POA: Diagnosis not present

## 2014-09-09 DIAGNOSIS — Z808 Family history of malignant neoplasm of other organs or systems: Secondary | ICD-10-CM | POA: Diagnosis not present

## 2014-09-09 DIAGNOSIS — J9621 Acute and chronic respiratory failure with hypoxia: Secondary | ICD-10-CM | POA: Diagnosis present

## 2014-09-09 DIAGNOSIS — Z7951 Long term (current) use of inhaled steroids: Secondary | ICD-10-CM | POA: Diagnosis not present

## 2014-09-09 DIAGNOSIS — Z7982 Long term (current) use of aspirin: Secondary | ICD-10-CM | POA: Diagnosis not present

## 2014-09-09 DIAGNOSIS — I10 Essential (primary) hypertension: Secondary | ICD-10-CM | POA: Diagnosis not present

## 2014-09-09 DIAGNOSIS — Z9981 Dependence on supplemental oxygen: Secondary | ICD-10-CM | POA: Diagnosis not present

## 2014-09-09 DIAGNOSIS — Z87891 Personal history of nicotine dependence: Secondary | ICD-10-CM | POA: Diagnosis not present

## 2014-09-09 DIAGNOSIS — J441 Chronic obstructive pulmonary disease with (acute) exacerbation: Secondary | ICD-10-CM | POA: Diagnosis not present

## 2014-09-09 DIAGNOSIS — N179 Acute kidney failure, unspecified: Secondary | ICD-10-CM | POA: Diagnosis present

## 2014-09-09 DIAGNOSIS — R0602 Shortness of breath: Secondary | ICD-10-CM | POA: Diagnosis present

## 2014-09-09 DIAGNOSIS — E1165 Type 2 diabetes mellitus with hyperglycemia: Secondary | ICD-10-CM | POA: Diagnosis present

## 2014-09-09 DIAGNOSIS — I272 Other secondary pulmonary hypertension: Secondary | ICD-10-CM | POA: Diagnosis present

## 2014-09-09 DIAGNOSIS — G4733 Obstructive sleep apnea (adult) (pediatric): Secondary | ICD-10-CM | POA: Diagnosis not present

## 2014-09-09 DIAGNOSIS — Z6832 Body mass index (BMI) 32.0-32.9, adult: Secondary | ICD-10-CM | POA: Diagnosis not present

## 2014-09-09 DIAGNOSIS — I509 Heart failure, unspecified: Secondary | ICD-10-CM | POA: Diagnosis not present

## 2014-09-09 LAB — GLUCOSE, CAPILLARY
GLUCOSE-CAPILLARY: 260 mg/dL — AB (ref 70–99)
Glucose-Capillary: 222 mg/dL — ABNORMAL HIGH (ref 70–99)
Glucose-Capillary: 353 mg/dL — ABNORMAL HIGH (ref 70–99)
Glucose-Capillary: 208 mg/dL — ABNORMAL HIGH (ref 70–99)

## 2014-09-09 LAB — BASIC METABOLIC PANEL
Anion gap: 6 (ref 5–15)
BUN: 45 mg/dL — AB (ref 6–23)
CO2: 26 mmol/L (ref 19–32)
Calcium: 8.5 mg/dL (ref 8.4–10.5)
Chloride: 102 mmol/L (ref 96–112)
Creatinine, Ser: 1.75 mg/dL — ABNORMAL HIGH (ref 0.50–1.35)
GFR calc non Af Amer: 39 mL/min — ABNORMAL LOW (ref >90)
GFR, EST AFRICAN AMERICAN: 46 mL/min — AB (ref >90)
GLUCOSE: 234 mg/dL — AB (ref 70–99)
Potassium: 4.1 mmol/L (ref 3.5–5.1)
SODIUM: 134 mmol/L — AB (ref 135–145)

## 2014-09-09 LAB — TROPONIN I: Troponin I: <0.03 ng/mL (ref <0.031)

## 2014-09-09 MED ORDER — PREDNISONE 20 MG PO TABS
40.0000 mg | ORAL_TABLET | Freq: Every day | ORAL | Status: DC
  Administered 2014-09-10: 40 mg via ORAL
  Filled 2014-09-09: qty 2

## 2014-09-09 MED ORDER — ALBUTEROL SULFATE (2.5 MG/3ML) 0.083% IN NEBU
INHALATION_SOLUTION | RESPIRATORY_TRACT | Status: AC
  Administered 2014-09-09: 2.5 mg via RESPIRATORY_TRACT
  Filled 2014-09-09: qty 3

## 2014-09-09 MED ORDER — ALBUTEROL SULFATE (2.5 MG/3ML) 0.083% IN NEBU
2.5000 mg | INHALATION_SOLUTION | RESPIRATORY_TRACT | Status: DC | PRN
  Administered 2014-09-09 – 2014-09-10 (×4): 2.5 mg via RESPIRATORY_TRACT
  Filled 2014-09-09 (×2): qty 3

## 2014-09-09 NOTE — Progress Notes (Signed)
Utilization Review completed.  

## 2014-09-09 NOTE — Progress Notes (Signed)
[facility] PROGRESS NOTE  Bradley Fuller XBJ:[id number] DOB: Oct 18, 1950 DOA: 09/08/2014 PCP: Juliette AlcideBURDINE,STEVEN E, MD  Assessment/Plan: 1. Acute on chronic respiratory failure. He is chronically on 3 L of oxygen for COPD/CHF. His respiratory failure appears to be approaching baseline. Continue current treatments 2. COPD exacerbation. Wheezing appears to be significantly improved. We will discontinue Solu-Medrol and start prednisone tomorrow. Continue bronchodilators and antibiotics. 3. Acute on chronic diastolic congestive heart failure. Patient reports compliance with Lasix. He may inadvertently been drinking too much fluid. We will continue with diuresis for today. 4. Acute renal failure. Unclear what is patient's baseline creatinine. Prior lab values are from 2012. Continue to monitor in the setting of diuresis. May have an element of chronic kidney disease. 5. Obstructive sleep apnea. Continue with CPAP 6. Diabetes. Continue with home medications and sliding insulin scale.  Code Status: Full code Family Communication: Discussed with patient and family at the bedside Disposition Plan: Discharge home once improved   Consultants:    Procedures:  Echo: - Left ventricle: The cavity size was normal. Wall thickness was increased in a pattern of mild LVH. Systolic function was normal. The estimated ejection fraction was in the range of 60% to 65%. Wall motion was normal; there were no regional wall motion abnormalities. Doppler parameters are consistent with abnormal left ventricular relaxation (grade 1 diastolic dysfunction). - Aortic valve: Cusp separation was mildly reduced. - Left atrium: The atrium was mildly dilated. - Right ventricle: The cavity size was moderately dilated. Wall thickness was normal. Systolic function was moderately reduced. - Right atrium: The atrium was moderately dilated. - Pulmonary arteries: Systolic pressure was severely increased. PA peak  pressure: 80 mm Hg (S).  Antibiotics:  Levaquin 2/26  HPI/Subjective: Patient is feeling better. Shortness of breath is improving. He does have a slightly productive cough  Objective: Filed Vitals:   09/09/14 1700  BP: 144/84  Pulse: 84  Temp: 97.8 F (36.6 C)  Resp: 20    Intake/Output Summary (Last 24 hours) at 09/09/14 1914 Last data filed at 09/09/14 1700  Gross per 24 hour  Intake    720 ml  Output   2250 ml  Net  -1530 ml   Filed Weights   09/08/14 1321 09/08/14 2231  Weight: 104.146 kg (229 lb 9.6 oz) 99.4 kg (219 lb 2.2 oz)    Exam:   General:  No acute distress  Cardiovascular: S1, S2, regular rate and rhythm  Respiratory: Clear to auscultation bilaterally with fair air movement  Abdomen: Soft, nontender, positive bowel sounds  Musculoskeletal: 1+ pitting edema bilaterally   Data Reviewed: Basic Metabolic Panel:  Recent Labs Lab 09/08/14 1421 09/09/14 0628  NA 135 134*  K 4.0 4.1  CL 105 102  CO2 28 26  GLUCOSE 341* 234*  BUN 38* 45*  CREATININE 1.83* 1.75*  CALCIUM 8.2* 8.5   Liver Function Tests: No results for input(s): AST, ALT, ALKPHOS, BILITOT, PROT, ALBUMIN in the last 168 hours. No results for input(s): LIPASE, AMYLASE in the last 168 hours. No results for input(s): AMMONIA in the last 168 hours. CBC:  Recent Labs Lab 09/08/14 1421  WBC 7.4  HGB 11.4*  HCT 37.7*  MCV 75.0*  PLT 166   Cardiac Enzymes:  Recent Labs Lab 09/08/14 1421 09/08/14 1910 09/09/14 0240 09/09/14 0628  TROPONINI <0.03 <0.03 <0.03 <0.03   BNP (last 3 results)  Recent Labs  09/08/14 1421  BNP 312.0*    ProBNP (last 3 results) No results for input(s):  PROBNP in the last 8760 hours.  CBG:  Recent Labs Lab 09/08/14 1937 09/08/14 2118 09/09/14 0830 09/09/14 1201  GLUCAP 251* 271* 222* 353*    No results found for this or any previous visit (from the past 240 hour(s)).   Studies: Dg Chest 2 View (if Patient Has Fever And/or  Copd)  09/08/2014   CLINICAL DATA:  Cough  EXAM: CHEST  2 VIEW  COMPARISON:  05/20/2010  FINDINGS: Cardiac enlargement. Pulmonary artery enlargement consistent with pulmonary artery hypertension. Mild venous congestion and early edema also present. Negative for pleural effusion.  IMPRESSION: Pulmonary artery hypertension with mild heart failure.   Electronically Signed   By: Marlan Palau M.D.   On: 09/08/2014 13:55    Scheduled Meds: . amLODipine  10 mg Oral Daily  . aspirin  81 mg Oral q morning - 10a  . atorvastatin  40 mg Oral QHS  . budesonide-formoterol  2 puff Inhalation BID  . enoxaparin (LOVENOX) injection  30 mg Subcutaneous Q24H  . fluticasone  2 spray Each Nare BID  . furosemide  40 mg Intravenous Q12H  . glyBURIDE  10 mg Oral BID WC  . insulin aspart  0-15 Units Subcutaneous TID WC  . insulin aspart  0-5 Units Subcutaneous QHS  . levofloxacin (LEVAQUIN) IV  500 mg Intravenous Q24H  . loratadine  10 mg Oral Daily  . methylPREDNISolone (SOLU-MEDROL) injection  80 mg Intravenous Q12H  . pantoprazole  40 mg Oral Daily  . pramipexole  0.125 mg Oral QHS  . sodium chloride  3 mL Intravenous Q12H  . topiramate  25 mg Oral BID   Continuous Infusions:   Active Problems:   Morbid obesity   Obstructive sleep apnea   Essential hypertension, benign   COPD (chronic obstructive pulmonary disease)   CHF exacerbation   Congestive heart failure    Time spent:    [facility]  [facility] Pager [phone]. If 7PM-7AM, please contact night-coverage at [email or web address], password [facility] 09/09/2014, 7:14 PM  LOS: 1 day

## 2014-09-09 NOTE — Progress Notes (Signed)
Patient c/o SOB and wheezing. Patient requested a breathing treatment but breathing treatments are not ordered. Contacted hospitalist. Awaiting response.

## 2014-09-09 NOTE — Progress Notes (Signed)
  Echocardiogram 2D Echocardiogram has been performed.  Stacey DrainWhite, [place] J 09/09/2014, 8:53 AM

## 2014-09-09 NOTE — Progress Notes (Signed)
Received call back from hospitalist. Received order for PRN Albuterol nebs.

## 2014-09-10 DIAGNOSIS — J9621 Acute and chronic respiratory failure with hypoxia: Secondary | ICD-10-CM

## 2014-09-10 DIAGNOSIS — I5033 Acute on chronic diastolic (congestive) heart failure: Secondary | ICD-10-CM

## 2014-09-10 LAB — BASIC METABOLIC PANEL
Anion gap: 2 — ABNORMAL LOW (ref 5–15)
BUN: 53 mg/dL — ABNORMAL HIGH (ref 6–23)
CO2: 29 mmol/L (ref 19–32)
Calcium: 8.4 mg/dL (ref 8.4–10.5)
Chloride: 105 mmol/L (ref 96–112)
Creatinine, Ser: 1.71 mg/dL — ABNORMAL HIGH (ref 0.50–1.35)
GFR calc Af Amer: 47 mL/min — ABNORMAL LOW (ref >90)
GFR calc non Af Amer: 41 mL/min — ABNORMAL LOW (ref >90)
Glucose, Bld: 205 mg/dL — ABNORMAL HIGH (ref 70–99)
Potassium: 3.9 mmol/L (ref 3.5–5.1)
SODIUM: 136 mmol/L (ref 135–145)

## 2014-09-10 LAB — GLUCOSE, CAPILLARY
Glucose-Capillary: 194 mg/dL — ABNORMAL HIGH (ref 70–99)
Glucose-Capillary: 299 mg/dL — ABNORMAL HIGH (ref 70–99)

## 2014-09-10 MED ORDER — LEVOFLOXACIN 500 MG PO TABS: 500.0000 mg | ORAL_TABLET | Freq: Every day | ORAL | Status: AC

## 2014-09-10 MED ORDER — PREDNISONE 10 MG PO TABS: ORAL_TABLET | ORAL | Status: AC

## 2014-09-10 MED ORDER — FUROSEMIDE 40 MG PO TABS: 40.0000 mg | ORAL_TABLET | Freq: Two times a day (BID) | ORAL | Status: AC

## 2014-09-10 MED ORDER — ENOXAPARIN SODIUM 40 MG/0.4ML ~~LOC~~ SOLN: 40.0000 mg | SUBCUTANEOUS | Status: DC

## 2014-09-10 NOTE — Progress Notes (Signed)
Discharge instruction reviewed with patient. IV removed. No distress noted. Patient waiting for transportation home.

## 2014-09-10 NOTE — Progress Notes (Signed)
Patient ambulated around unit oxygen saturation 83% on room air. Patient oxygen resumed at 2l/min oxygen saturation 95%.

## 2014-09-10 NOTE — Discharge Summary (Signed)
Physician Discharge Summary  Bradley Fuller:[id number] DOB: 1950/11/03 DOA: 09/08/2014  PCP: Juliette Alcide, MD  Admit date: 09/08/2014 Discharge date: 09/10/2014  Time spent: 40 minutes  Recommendations for Outpatient Follow-up:  1. Patient will discharge home and follow-up with his primary care physician at the [facility]  Discharge Diagnoses:  Active Problems:   Morbid obesity   Obstructive sleep apnea   Essential hypertension, benign   COPD exacerbation   Acute on chronic diastolic congestive heart failure   Congestive heart failure   Acute on chronic respiratory failure with hypoxia   Discharge Condition: improved  Diet recommendation: low salt, low carb  Filed Weights   09/08/14 1321 09/08/14 2231 09/10/14 0456  Weight: 104.146 kg (229 lb 9.6 oz) 99.4 kg (219 lb 2.2 oz) 98.5 kg (217 lb 2.5 oz)    History of present illness:  This patient presents to the hospital with complaints of shortness of breath and productive cough. He has chronic respiratory failure on oxygen. He has checked his sleep apnea on CPAP. He has a history of COPD as well as congestive heart failure. The patient had noted swelling in his legs. He was admitted for further treatment of congestive heart failure as well as COPD.  Hospital Course:  Patient was adequately diuresed with intravenous Lasix. His lower sternotomy edema has resolved and his lungs are clear. He is able to ambulate without any difficulty. His wheezing has also resolved. He is currently on a prednisone taper. He reports he'll follow up with his primary care physician as her nebulizer treatments. The patient appears to be very knowledgeable about managing his CHF. He is very diligent in checking daily weight, blood pressures at home, and avoiding any excess salt. He was educated on the importance of fluid restriction. We will keep him on his daily Lasix which may need to be further adjusted in the outpatient setting. He is advised to check his  weight on a daily basis, and if his weight is starting to increase, he can take an extra Lasix. She feels comfortable with this plan and is ready to discharge home.  Procedures:  Echo: - Left ventricle: The cavity size was normal. Wall thickness was increased in a pattern of mild LVH. Systolic function was normal. The estimated ejection fraction was in the range of 60% to 65%. Wall motion was normal; there were no regional wall motion abnormalities. Doppler parameters are consistent with abnormal left ventricular relaxation (grade 1 diastolic dysfunction). - Aortic valve: Cusp separation was mildly reduced. - Left atrium: The atrium was mildly dilated. - Right ventricle: The cavity size was moderately dilated. Wall thickness was normal. Systolic function was moderately reduced. - Right atrium: The atrium was moderately dilated. - Pulmonary arteries: Systolic pressure was severely increased. PA peak pressure: 80 mm Hg (S).  Consultations:    Discharge Exam: Filed Vitals:   09/10/14 1343  BP: 128/74  Pulse: 78  Temp: 97.5 F (36.4 C)  Resp: 20    General: NAD Cardiovascular: S1, S2 RRR Respiratory: CTA B  Discharge Instructions   Discharge Instructions    (HEART FAILURE PATIENTS) Call MD:  Anytime you have any of the following symptoms: 1) 3 pound weight gain in 24 hours or 5 pounds in 1 week 2) shortness of breath, with or without a dry hacking cough 3) swelling in the hands, feet or stomach 4) if you have to sleep on extra pillows at night in order to breathe.    Complete by:  As  directed      Diet - low sodium heart healthy    Complete by:  As directed      Diet Carb Modified    Complete by:  As directed      Increase activity slowly    Complete by:  As directed           Current Discharge Medication List    START taking these medications   Details  furosemide (LASIX) 40 MG tablet Take 1 tablet (40 mg total) by mouth 2 (two) times daily. Qty: 60  tablet, Refills: 1    levofloxacin (LEVAQUIN) 500 MG tablet Take 1 tablet (500 mg total) by mouth daily. Qty: 2 tablet, Refills: 0    predniSONE (DELTASONE) 10 MG tablet Take  po daily for 1 day then  po daily for 2 days then  po daily for 2 days then  po daily for 2 days then stop Qty: 16 tablet, Refills: 0      CONTINUE these medications which have NOT CHANGED   Details  amLODipine (NORVASC) 10 MG tablet Take 10 mg by mouth daily.    ammonium lactate (LAC-HYDRIN) 12 % lotion Apply 1 application topically as needed for dry skin.     aspirin 81 MG tablet Take 81 mg by mouth every morning.     atorvastatin (LIPITOR) 80 MG tablet Take 40 mg by mouth at bedtime.    budesonide-formoterol (SYMBICORT) 160-4.5 MCG/ACT inhaler Inhale 2 puffs into the lungs 2 (two) times daily.      capsaicin (ZOSTRIX) 0.025 % cream Apply 1 application topically daily as needed.     cetirizine (ZYRTEC) 10 MG tablet Take 10 mg by mouth at bedtime.    fluticasone (FLONASE) 50 MCG/ACT nasal spray Place 2 sprays into the nose 2 (two) times daily.     glyBURIDE (DIABETA) 5 MG tablet Take 10 mg by mouth 2 (two) times daily with a meal.      omeprazole (PRILOSEC) 20 MG capsule Take 20 mg by mouth daily.      OXYGEN Inhale 3-8 L into the lungs See admin instructions. 3L at rest and 8L in exertion    pramipexole (MIRAPEX) 0.125 MG tablet Take 0.125 mg by mouth at bedtime.      sertraline (ZOLOFT) 100 MG tablet Take 50 mg by mouth at bedtime as needed.    topiramate (TOPAMAX) 25 MG tablet Take 25 mg by mouth 2 (two) times daily.        STOP taking these medications     cyclobenzaprine (FLEXERIL) 10 MG tablet      HYDROcodone-acetaminophen (NORCO/VICODIN) 5-325 MG per tablet        Allergies  Allergen Reactions  . Lisinopril Swelling    Tongue swell.   Follow-up Information    Follow up with follow up with your doctor at the [facility] as soon as possible.       The results of  significant diagnostics from this hospitalization (including imaging, microbiology, ancillary and laboratory) are listed below for reference.    Significant Diagnostic Studies: Dg Chest 2 View (if Patient Has Fever And/or Copd)  09/08/2014   CLINICAL DATA:  Cough  EXAM: CHEST  2 VIEW  COMPARISON:  05/20/2010  FINDINGS: Cardiac enlargement. Pulmonary artery enlargement consistent with pulmonary artery hypertension. Mild venous congestion and early edema also present. Negative for pleural effusion.  IMPRESSION: Pulmonary artery hypertension with mild heart failure.   Electronically Signed   By: Marlan Palau M.D.  On: 09/08/2014 13:55    Microbiology: No results found for this or any previous visit (from the past 240 hour(s)).   Labs: Basic Metabolic Panel:  Recent Labs Lab 09/08/14 1421 09/09/14 0628 09/10/14 0646  NA 135 134* 136  K 4.0 4.1 3.9  CL 105 102 105  CO2 28 26 29   GLUCOSE 341* 234* 205*  BUN 38* 45* 53*  CREATININE 1.83* 1.75* 1.71*  CALCIUM 8.2* 8.5 8.4   Liver Function Tests: No results for input(s): AST, ALT, ALKPHOS, BILITOT, PROT, ALBUMIN in the last 168 hours. No results for input(s): LIPASE, AMYLASE in the last 168 hours. No results for input(s): AMMONIA in the last 168 hours. CBC:  Recent Labs Lab 09/08/14 1421  WBC 7.4  HGB 11.4*  HCT 37.7*  MCV 75.0*  PLT 166   Cardiac Enzymes:  Recent Labs Lab 09/08/14 1421 09/08/14 1910 09/09/14 0240 09/09/14 0628  TROPONINI <0.03 <0.03 <0.03 <0.03   BNP: BNP (last 3 results)  Recent Labs  09/08/14 1421  BNP 312.0*    ProBNP (last 3 results) No results for input(s): PROBNP in the last 8760 hours.  CBG:  Recent Labs Lab 09/09/14 1201 09/09/14 1630 09/09/14 2059 09/10/14 0824 09/10/14 1156  GLUCAP 353* 208* 260* 194* 299*       Signed:  MEMON,JEHANZEB  [facility] 09/10/2014, 2:41 PM

## 2016-02-18 IMAGING — DX DG CHEST 2V
2 series · 2 of 2 positions shown · non-contrast
Comparison: 05/20/2010

CLINICAL DATA: Cough

EXAM:
CHEST  2 VIEW

[chest pa]
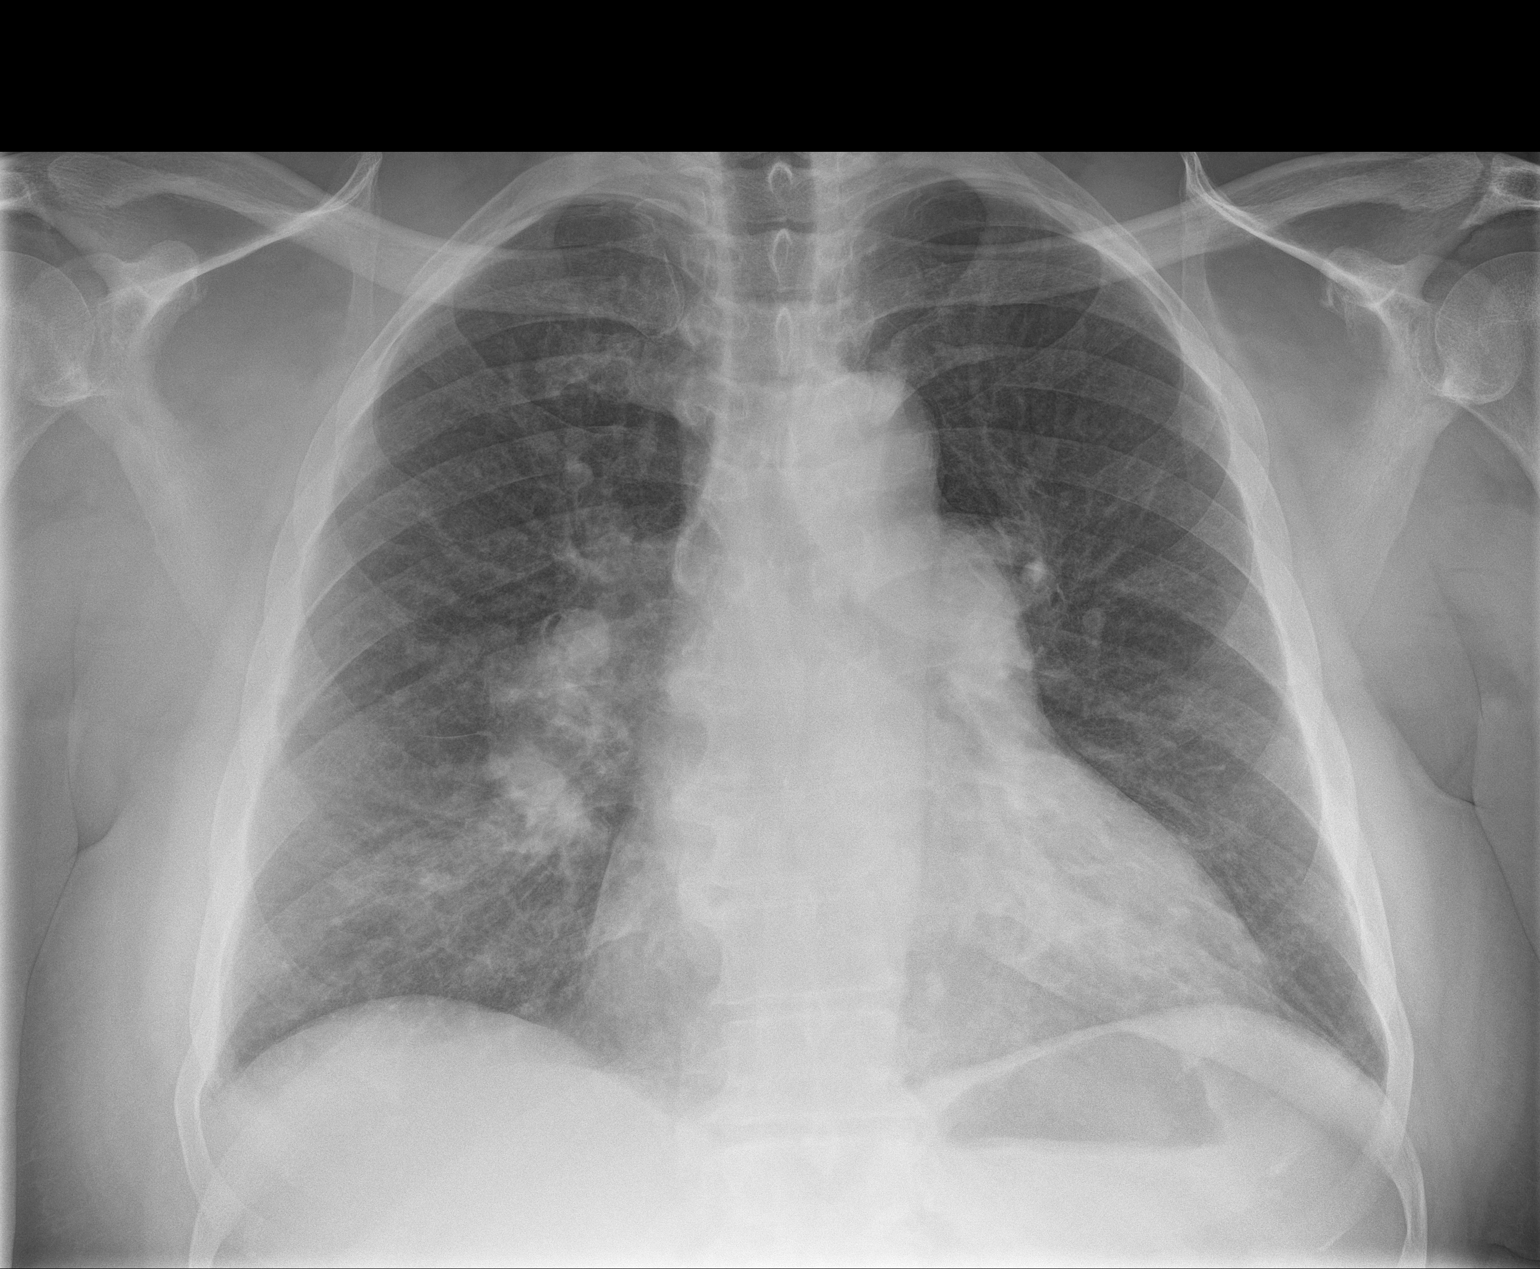

[chest lat]
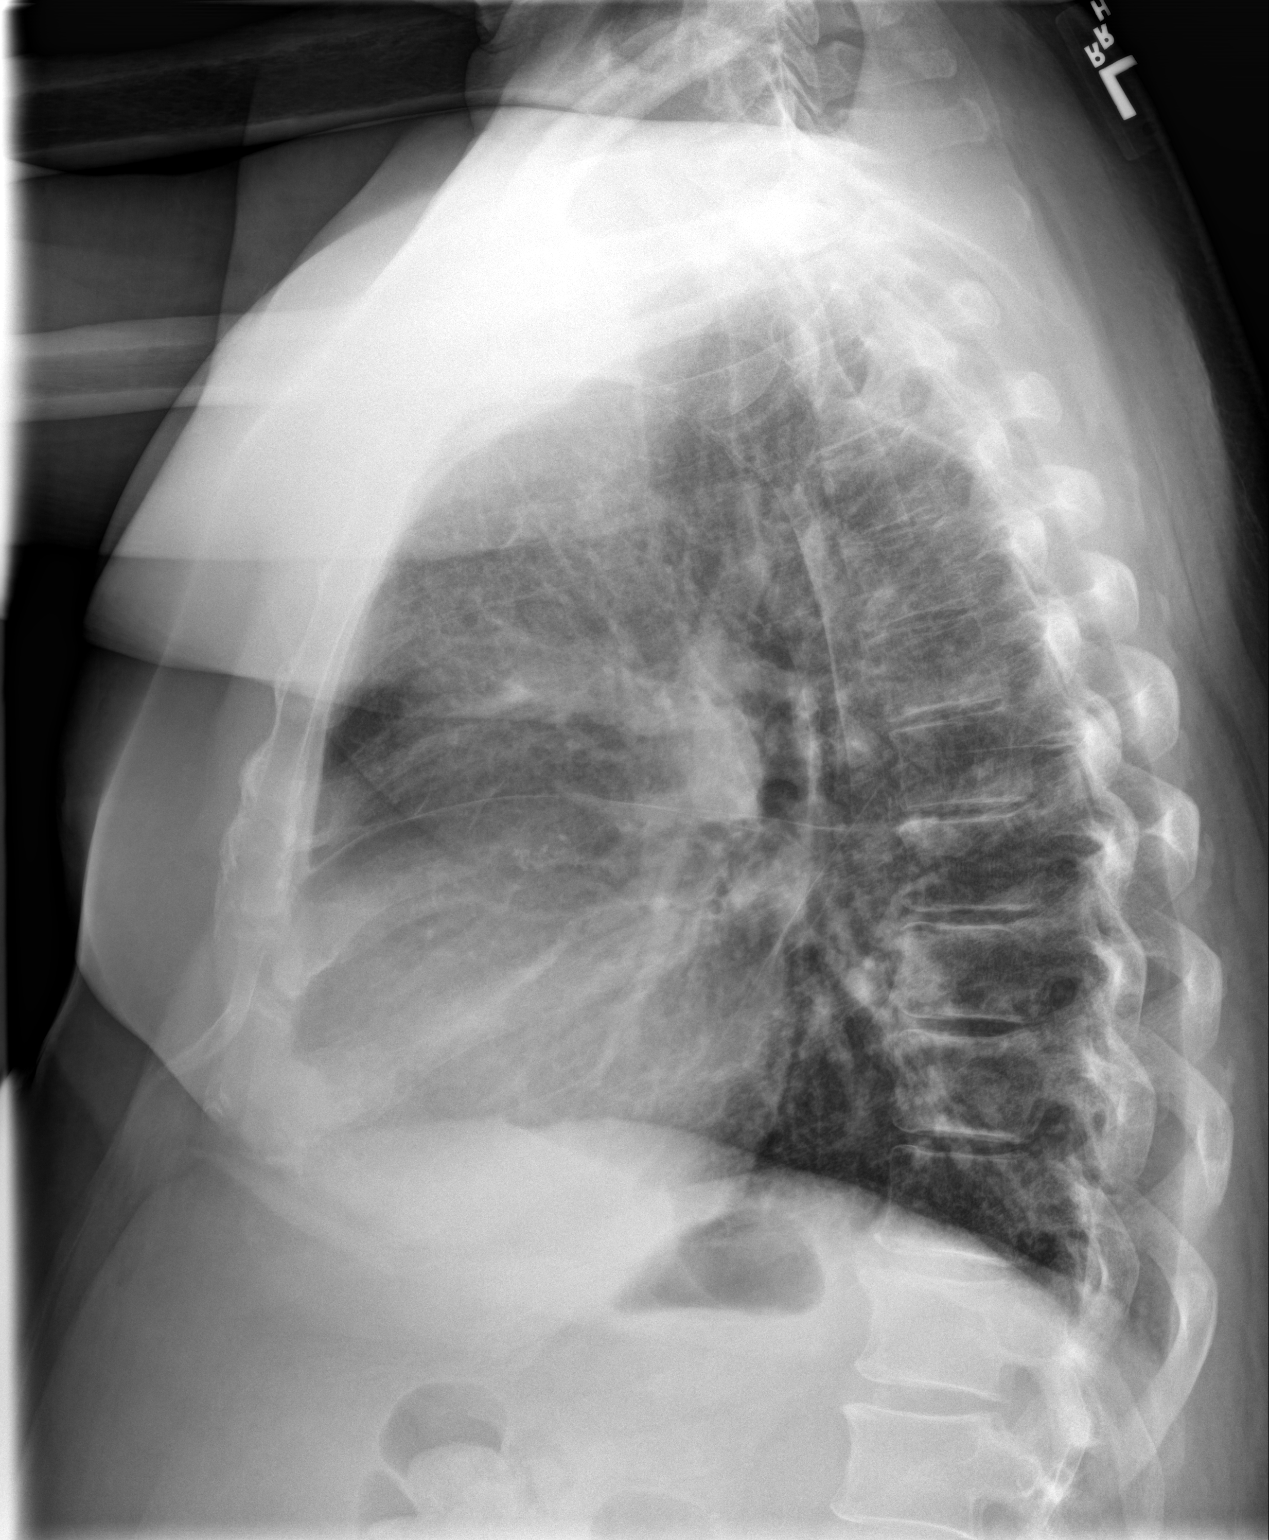

[2 of 2 positions shown; findings below may reference images not displayed]

FINDINGS: Cardiac enlargement. Pulmonary artery enlargement consistent with
pulmonary artery hypertension. Mild venous congestion and early
edema also present. Negative for pleural effusion.
IMPRESSION: Pulmonary artery hypertension with mild heart failure.

## 2017-04-08 LAB — PSA, DIAGNOSTIC (PROSTATE SPECIFIC AG): Prostate Specific Ag: 1.21 ng/ml (ref <=4.1)

## 2017-05-14 DEATH — deceased

## 2018-12-18 LAB — TESTOSTERONE
Testosterone, Serum (Total): 538 ng/dl (ref 348–1197)
Testosterone: 538 ng/dL (ref 348–1197)

## 2019-09-14 LAB — VITAMIN D, 25 HYDROXY: Vit D 25-Hydroxy: 40.6 ng/ml (ref 32.0–100.0)

## 2019-09-14 LAB — PSA, DIAGNOSTIC (PROSTATE SPECIFIC AG): Prostate Specific Ag: 0.91 ng/ml (ref <=4.1)

## 2019-09-14 LAB — VITAMIN D 25 HYDROXY: Vit D, 25-Hydroxy: 40.6 ng/ml (ref 32.0–100.0)

## 2019-09-14 LAB — PSA PROSTATIC SPECIFIC ANTIGEN: PSA: 0.91 ng/mL (ref <=4.1)

## 2020-01-13 ENCOUNTER — Ambulatory Visit: Attending: Urology | Primary: Family Medicine

## 2020-01-13 ENCOUNTER — Ambulatory Visit: Admit: 2020-01-13 | Discharge: 2020-01-13 | Attending: Urology | Primary: Family Medicine

## 2020-01-13 DIAGNOSIS — R319 Hematuria, unspecified: Secondary | ICD-10-CM

## 2020-01-13 LAB — AMB POC URINALYSIS DIP STICK AUTO W/O MICRO
Bilirubin (UA POC): NEGATIVE
Bilirubin, Urine, POC: NEGATIVE
Glucose (UA POC): NEGATIVE
Glucose, Urine, POC: NEGATIVE
Ketones (UA POC): NEGATIVE
Ketones, Urine, POC: NEGATIVE
Leukocyte Esterase, Urine, POC: NEGATIVE
Leukocyte esterase (UA POC): NEGATIVE
Nitrite, Urine, POC: NEGATIVE
Nitrites (UA POC): NEGATIVE
Protein (UA POC): NEGATIVE
Protein, Urine, POC: NEGATIVE
Specific Gravity, Urine, POC: 1.015 NA (ref 1.001–1.035)
Specific gravity (UA POC): 1.015 (ref 1.001–1.035)
Urobilinogen (UA POC): 0.2 (ref 0.2–1)
Urobilinogen, POC: 0.2 (ref 0.2–1)
pH (UA POC): 7.5 (ref 4.6–8.0)
pH, Urine, POC: 7.5 NA (ref 4.6–8.0)

## 2020-01-13 NOTE — Progress Notes (Signed)
Progress Notes by Alesia BandaSanders, Schylar Wuebker W, MD at 01/13/20 1330                Author: Alesia BandaSanders, Jakaila Norment W, MD  Service: --  Author Type: Physician       Filed: 01/13/20 1418  Encounter Date: 01/13/2020  Status: Signed          Editor: Alesia BandaSanders, Briceida Rasberry W, MD (Physician)                    Kenneth Klein   DOB: 05-12-1951   Encounter Date: 01/13/2020         Encounter Diagnoses              ICD-10-CM  ICD-9-CM          1.  Hematuria, unspecified type   R31.9  599.70           ASSESSMENT:   1.  Microscopic Hematuria. Risk factors: smoker-quit 3 months ago. Will pursue hematuria work up with cystoscopy to rule out upper tract and bladder pathology.          PLAN:   Urine cytology today.    DRE benign    Schedule Cystoscopy to further assess hematuria         DISCUSSION: I have discussed need for work up to rule out significant pathology, which includes cystoscopy and upper tract imaging. Patients  presenting with gross hematuria have an approximate 23% chance of having a urologic malignancy as the cause for hematuria and those presenting with microscopic hematuria have an approximate 5% chance. Patients with gross hematuria and a negative work  up have around an 18% chance of missed malignancy and therefore, I recommend some form of follow up.  Approximately 43% of patients who undergo work up for microhematuria have a non-diagnostic work up, only a very small percentage of these patients have  a missed malignancy and therefore, the follow-up can be limited. The American Urological Association's Best Practice Policy (2012) Panel on asymptomatic microscopic hematuria defines microscopic hematuria as three or more red blood cells per high-power  microscopic field in urinary sediment from a well collected urinalysis specimen.       Patient's BMI is out of the normal parameters.  Information about BMI was given and patient was advised to follow-up with their PCP for further management.        Chief Complaint       Patient presents  with        ?  Hematuria             patient states he was referred by Dr. Durene CalHunter           HISTORY OF PRESENT ILLNESS:   Kenneth Klein  is a 69 y.o. male who presents  today in consultation for microscopic hematuria and has been referred by Dr. Durene CalHunter. History of smoking, quit 3 months ago. Denies gross hematuria. No urinary complaints. Patient denies any bothersome urinary symptoms. Denies flank pain, gross hematuria,  dysuria, frequency, urgency, incontinence, or straining to void. Patient is aysmptomatic for infection today. No n/v/f/c               Labs/Radiology   PSA Trend      Component  Prostate Specific Ag     Latest Ref Rng & Units  <=4.1 ng/ml        09/14/2019  0.91     04/08/2017  1.21        CTU  11/18/2019   1. Bilateral hypodense renal lesions, likely multiple cysts as described above.   2. No urinary calculi or obstructive uropathy.   3. Mild prostate enlargement.          Results for orders placed or performed in visit on 01/13/20     AMB POC URINALYSIS DIP STICK AUTO W/O MICRO         Result  Value  Ref Range            Color (UA POC)  Yellow         Clarity (UA POC)  Clear         Glucose (UA POC)  Negative  Negative       Bilirubin (UA POC)  Negative  Negative       Ketones (UA POC)  Negative  Negative       Specific gravity (UA POC)  1.015  1.001 - 1.035       Blood (UA POC)  1+  Negative       pH (UA POC)  7.5  4.6 - 8.0       Protein (UA POC)  Negative  Negative       Urobilinogen (UA POC)  0.2 mg/dL  0.2 - 1       Nitrites (UA POC)  Negative  Negative            Leukocyte esterase (UA POC)  Negative  Negative             Past Medical History:        Diagnosis  Date         ?  COPD (chronic obstructive pulmonary disease) (HCC)       ?  Diabetes mellitus (HCC)       ?  Hematuria       ?  Hypercholesteremia       ?  Impotence of organic origin           ?  Vitamin D deficiency            Past Surgical History:         Procedure  Laterality  Date          ?  HX COLONOSCOPY              ?  HX  ENDOSCOPY              Family History         Problem  Relation  Age of Onset          ?  Diabetes  Father            Social History          Socioeconomic History         ?  Marital status:  MARRIED              Spouse name:  Not on file         ?  Number of children:  Not on file     ?  Years of education:  Not on file     ?  Highest education level:  Not on file       Occupational History        ?  Not on file       Tobacco Use         ?  Smoking status:  Former Smoker  Packs/day:  2.00         Years:  43.00         Pack years:  86.00         Types:  Cigarettes         Quit date:  06/10/2018         Years since quitting:  1.5         ?  Smokeless tobacco:  Former Network engineer and Sexual Activity         ?  Alcohol use:  Yes              Alcohol/week:  13.0 standard drinks         Types:  6 Cans of beer, 7 Standard drinks or equivalent per week         ?  Drug use:  Never     ?  Sexual activity:  Not on file        Other Topics  Concern        ?  Not on file       Social History Narrative        ?  Not on file          Social Determinants of Health          Financial Resource Strain:         ?  Difficulty of Paying Living Expenses:        Food Insecurity:         ?  Worried About Programme researcher, broadcasting/film/video in the Last Year:      ?  Barista in the Last Year:        Transportation Needs:         ?  Freight forwarder (Medical):      ?  Lack of Transportation (Non-Medical):        Physical Activity:         ?  Days of Exercise per Week:      ?  Minutes of Exercise per Session:        Stress:         ?  Feeling of Stress :        Social Connections:         ?  Frequency of Communication with Friends and Family:      ?  Frequency of Social Gatherings with Friends and Family:      ?  Attends Religious Services:      ?  Active Member of Clubs or Organizations:      ?  Attends Banker Meetings:      ?  Marital Status:        Intimate Partner Violence:         ?  Fear of Current or  Ex-Partner:      ?  Emotionally Abused:      ?  Physically Abused:         ?  Sexually Abused:         No Known Allergies         Review of Systems   Constitutional: Fever: No   Skin: Rash: No   HEENT: Hearing difficulty: No   Eyes: Blurred vision: No   Cardiovascular: Chest pain: No   Respiratory: Shortness of breath: No   Gastrointestinal: Nausea/vomiting: No   Musculoskeletal: Back pain: No   Neurological: Weakness: No  Psychological: Memory loss: No   Comments/additional findings:       Physical Examination   Visit Vitals      BP  100/60     Temp  97.1 ??F (36.2 ??C)     Ht  6\' 1"  (1.854 m)     Wt  187 lb (84.8 kg)        BMI  24.67 kg/m??        GU Male 01/13/2020:       Scrotum:   normal    Epididymides: nontender, symmetric     Testes:   Nontender, symmetric, no masses    Urethral Meatus:   No stenosis    Penis:  normal    Foreskin:  circumcised    Anus/Perineum:  normal    Sphincter tone:  normal    Rectum:  No masses    Prostate:  , nontender, no nodules         CC: 03/15/2020, MD       Ilona Sorrel, MD   [facility] of [place], [facility]         Today's medical documentation provided with the assistance of [facility], medical scribe for Waynard Edwards, MD on 01/13/2020 .

## 2020-01-17 ENCOUNTER — Ambulatory Visit: Attending: Urology | Primary: Family Medicine

## 2020-01-17 ENCOUNTER — Ambulatory Visit: Admit: 2020-01-17 | Discharge: 2020-01-17 | Attending: Urology | Primary: Family Medicine

## 2020-01-17 DIAGNOSIS — R319 Hematuria, unspecified: Secondary | ICD-10-CM

## 2020-01-17 LAB — AMB POC URINALYSIS DIP STICK AUTO W/O MICRO
Bilirubin (UA POC): NEGATIVE
Bilirubin, Urine, POC: NEGATIVE
Glucose (UA POC): NEGATIVE
Glucose, Urine, POC: NEGATIVE
Ketones (UA POC): NEGATIVE
Ketones, Urine, POC: NEGATIVE
Leukocyte Esterase, Urine, POC: NEGATIVE
Leukocyte esterase (UA POC): NEGATIVE
Nitrite, Urine, POC: NEGATIVE
Nitrites (UA POC): NEGATIVE
Protein (UA POC): NEGATIVE
Protein, Urine, POC: NEGATIVE
Specific Gravity, Urine, POC: 1.015 NA (ref 1.001–1.035)
Specific gravity (UA POC): 1.015 (ref 1.001–1.035)
Urobilinogen (UA POC): 0.2 (ref 0.2–1)
Urobilinogen, POC: 0.2 (ref 0.2–1)
pH (UA POC): 8.5 — AB (ref 4.6–8.0)
pH, Urine, POC: 8.5 NA — AB (ref 4.6–8.0)

## 2020-01-17 MED ORDER — CEPHALEXIN 500 MG CAP
500 mg | ORAL_CAPSULE | Freq: Once | ORAL | 0 refills | Status: AC
Start: 2020-01-17 — End: 2020-01-17

## 2020-01-17 NOTE — Progress Notes (Signed)
Universal Procedure Pause:    1. Correct patient confirmed:  yes    2. Allergies confirmed:  yes    3. Patient taking antiplatelet medications:  yes    4. Patient taking anticoagulants:  yes    5. Correct procedure and side confirmed and consent signed:  yes    6. Correct antibiotics confirmed:  yes

## 2020-01-17 NOTE — Progress Notes (Signed)
Cystoscopy Procedure      Patient Name: Kenneth Klein            Date of Procedure: 01/17/2020     Pre-procedure Diagnosis: microhematuria- previous smoker, quit 3 months ago  Encounter Diagnoses     ICD-10-CM ICD-9-CM   1. Hematuria, unspecified type  R31.9 599.70       1. Cystoscopy today, 01/17/2020:     Post-procedure Diagnosis: Same    Plan:  Cysto today, advised patient will see some blood in urine.  Cytology today  Follow up in 6 months  Would  Be  Good  Urolift candidate  If  Ever desires      Chief Complaint   Patient presents with   ??? Cystoscopy        History of Present Illness:  Kenneth Klein is a 69 y.o. male who presents today for cystoscopy to evaluate microhematuria. History of smoking, quit 3 months ago. Denies gross hematuria. Slow stream, not overly bothersome. Discontinued Flomax. No urinary complaints. Patient denies any bothersome urinary symptoms. Denies flank pain, gross hematuria, dysuria, frequency, urgency, incontinence, or straining to void. Patient is aysmptomatic for infection today. No n/v/f/c      Findings as follows:    Meatus: normal  Urethra: normal  Prostate: minimal intravesical adenoma  Bladder neck: obstructing lateral lobes  Bladder (Mucosa) Trigone:  normal  Trabeculation:normal  Diverticuli:  none  Lesion:  none    Antibiotic provided:  Yes Keflex    Lab / Imaging:   CTU 11/18/2019  1. Bilateral hypodense renal lesions, likely multiple cysts as described above.   2. No urinary calculi or obstructive uropathy.   3. Mild prostate enlargement.     Results for orders placed or performed in visit on 01/17/20   AMB POC URINALYSIS DIP STICK AUTO W/O MICRO   Result Value Ref Range    Color (UA POC) Yellow     Clarity (UA POC) Clear     Glucose (UA POC) Negative Negative    Bilirubin (UA POC) Negative Negative    Ketones (UA POC) Negative Negative    Specific gravity (UA POC) 1.015 1.001 - 1.035    Blood (UA POC) 1+ Negative    pH (UA POC) 8.5 (A) 4.6 - 8.0    Protein (UA POC) Negative  Negative    Urobilinogen (UA POC) 0.2 mg/dL 0.2 - 1    Nitrites (UA POC) Negative Negative    Leukocyte esterase (UA POC) Negative Negative       Consent:  All risks, benefits and options were reviewed in detail and the patient agrees to procedure. Risks include but are not limited to bleeding, infection, sepsis, death, dysuria and others.     Procedure:  The patient was placed in the supine position, and prepped and draped in the normal fashion. 5 ml of 4% Lidocaine gel was placed in the urethra. Once adequate anesthesia was achieved; the flexible cystoscope was placed into the bladder.     Past Medical History:   Diagnosis Date   ??? COPD (chronic obstructive pulmonary disease) (HCC)    ??? Diabetes mellitus (HCC)    ??? Hematuria    ??? Hypercholesteremia    ??? Impotence of organic origin    ??? Vitamin D deficiency      Past Surgical History:   Procedure Laterality Date   ??? HX COLONOSCOPY     ??? HX ENDOSCOPY       Family History   Problem  Relation Age of Onset   ??? Diabetes Father      Social History     Socioeconomic History   ??? Marital status: MARRIED     Spouse name: Not on file   ??? Number of children: Not on file   ??? Years of education: Not on file   ??? Highest education level: Not on file   Occupational History   ??? Not on file   Tobacco Use   ??? Smoking status: Former Smoker     Packs/day: 2.00     Years: 43.00     Pack years: 86.00     Types: Cigarettes     Quit date: 06/10/2018     Years since quitting: 1.6   ??? Smokeless tobacco: Former Estate agent and Sexual Activity   ??? Alcohol use: Yes     Alcohol/week: 13.0 standard drinks     Types: 6 Cans of beer, 7 Standard drinks or equivalent per week   ??? Drug use: Never   ??? Sexual activity: Not on file   Other Topics Concern   ??? Not on file   Social History Narrative   ??? Not on file     Social Determinants of Health     Financial Resource Strain:    ??? Difficulty of Paying Living Expenses:    Food Insecurity:    ??? Worried About Programme researcher, broadcasting/film/video in the Last Year:    ???  Barista in the Last Year:    Transportation Needs:    ??? Freight forwarder (Medical):    ??? Lack of Transportation (Non-Medical):    Physical Activity:    ??? Days of Exercise per Week:    ??? Minutes of Exercise per Session:    Stress:    ??? Feeling of Stress :    Social Connections:    ??? Frequency of Communication with Friends and Family:    ??? Frequency of Social Gatherings with Friends and Family:    ??? Attends Religious Services:    ??? Database administrator or Organizations:    ??? Attends Engineer, structural:    ??? Marital Status:    Intimate Programme researcher, broadcasting/film/video Violence:    ??? Fear of Current or Ex-Partner:    ??? Emotionally Abused:    ??? Physically Abused:    ??? Sexually Abused:      No Known Allergies  Current Outpatient Medications   Medication Sig Dispense Refill   ??? cephALEXin (Keflex) 500 mg capsule Take 1 Capsule by mouth once for 1 dose. 1 Capsule 0   ??? fluticasone propion-salmeteroL (ADVAIR/WIXELA) 250-50 mcg/dose diskus inhaler Take 1 Puff by inhalation every twelve (12) hours.     ??? metFORMIN (GLUCOPHAGE) 500 mg tablet Take 500 mg by mouth two (2) times daily (with meals).     ??? metoprolol tartrate (LOPRESSOR) 25 mg tablet Take 12.5 mg by mouth two (2) times a day.     ??? rivaroxaban (XARELTO) 20 mg tab tablet Take 20 mg by mouth daily.     ??? tadalafiL (CIALIS) 5 mg tablet TAKE 1 TABLET DAILY         Diagnoses:   Encounter Diagnoses     ICD-10-CM ICD-9-CM   1. Hematuria, unspecified type  R31.9 599.70       Universal Procedure Pause:  1. Correct patient confirmed:  yes  2. Allergies confirmed:  yes  3. Patient taking antiplatelet medications:  no  4. Patient  taking anticoagulants:  no  5. Correct procedure and side confirmed and consent signed:  yes  6. Correct antibiotics confirmed:  yes     Patient's BMI is out of the normal parameters.  Information about BMI was given and patient was advised to follow-up with their PCP for further management.    CC: Ilona Sorrel, MD     Drema Halon, MD  [facility]rology  of [place], [facility]    Today's medical documentation provided with the assistance of Waynard Edwards, medical scribe for Drema Halon, MD on 01/17/2020.

## 2020-01-19 LAB — URINE CYTOLOGY

## 2020-07-19 ENCOUNTER — Ambulatory Visit: Attending: Urology | Primary: Family Medicine

## 2020-07-19 ENCOUNTER — Ambulatory Visit: Admit: 2020-07-19 | Discharge: 2020-07-19 | Attending: Urology | Primary: Family Medicine

## 2020-07-19 DIAGNOSIS — R319 Hematuria, unspecified: Secondary | ICD-10-CM

## 2020-07-19 NOTE — Progress Notes (Signed)
Progress Notes by Alesia Banda, MD at 07/19/20 1015                Author: Alesia Banda, MD  Service: --  Author Type: Physician       Filed: 07/19/20 1034  Encounter Date: 07/19/2020  Status: Signed          Editor: Alesia Banda, MD (Physician)                    Geoffery Lyons   DOB: 1951/02/09   Encounter Date: 07/19/2020         Encounter Diagnoses              ICD-10-CM  ICD-9-CM          1.  Hematuria, unspecified type   R31.9  599.70           ASSESSMENT:   1.  Microscopic Hematuria    S/p negative cystoscopy 01/17/2020         PLAN:   No urine sample provided today   Return in 6 months for UA, urine cytology      DISCUSSION:      Patient's BMI is out of the normal parameters.  Information about BMI was given and patient was advised to follow-up with their PCP for further management.        Chief Complaint       Patient presents with        ?  Microscopic Hematuria             pt says he is to have a colonoscopy on 07/31/20, Pt c/o of urgency            HISTORY OF PRESENT ILLNESS:   Kenneth Klein  is a 70 y.o. male who presents  today in consultation for microscopic hematuria and has been referred by Dr. Durene Cal. History of smoking, quit in April 2021. Denies gross hematuria. Slow stream, not overly bothersome. Discontinued Flomax. No urinary complaints. Patient denies any bothersome  urinary symptoms. Has upcoming colonoscopy with Dr. Roseanne Reno. Denies flank pain, gross hematuria, dysuria, frequency, urgency, incontinence, or straining to void. Patient is aysmptomatic for infection today. No n/v/f/c         Labs/Radiology   PSA Trend      Component  Prostate Specific Ag     Latest Ref Rng & Units  <=4.1 ng/ml        09/14/2019  0.91     04/08/2017  1.21        Urine Cytology 01/17/2020       DIAGNOSIS:  Comment   03       Comment: URINE   NEGATIVE FOR HIGH-GRADE UROTHELIAL CARCINOMA (NHGUC).   BENIGN UROTHELIAL CELLS ARE PRESENT.         CTU 11/18/2019   1. Bilateral hypodense renal lesions, likely  multiple cysts as described above.   2. No urinary calculi or obstructive uropathy.   3. Mild prostate enlargement.          Results for orders placed or performed in visit on 01/17/20     AMB POC URINALYSIS DIP STICK AUTO W/O MICRO         Result  Value  Ref Range            Color (UA POC)  Yellow         Clarity (UA POC)  Clear  Glucose (UA POC)  Negative  Negative       Bilirubin (UA POC)  Negative  Negative       Ketones (UA POC)  Negative  Negative       Specific gravity (UA POC)  1.015  1.001 - 1.035       Blood (UA POC)  1+  Negative       pH (UA POC)  8.5 (A)  4.6 - 8.0       Protein (UA POC)  Negative  Negative       Urobilinogen (UA POC)  0.2 mg/dL  0.2 - 1       Nitrites (UA POC)  Negative  Negative       Leukocyte esterase (UA POC)  Negative  Negative       URINE CYTOLOGY         Result  Value  Ref Range            Source:  Comment         Clinician provided ICD10  Comment         DIAGNOSIS:  Comment         Signed out by:  Comment         Performed by:  C[place] description:  Comment               Past Medical History:        Diagnosis  Date         ?  COPD (chronic obstructive pulmonary disease) ([place])       ?  Diabetes mellitus ([place])       ?  Hematuria       ?  Hypercholesteremia       ?  Impotence of organic origin           ?  Vitamin D deficiency            Past Surgical History:         Procedure  Laterality  Date          ?  HX COLONOSCOPY              ?  HX ENDOSCOPY              Family History         Problem  Relation  Age of Onset          ?  Diabetes  Father            Social History          Socioeconomic History         ?  Marital status:  MARRIED              Spouse name:  Not on file         ?  Number of children:  Not on file     ?  Years of education:  Not on file     ?  Highest education level:  Not on file       Occupational History        ?  Not on file       Tobacco Use         ?  Smoking status:  Former Smoker              Packs/day:  2.00  Years:   43.00         Pack years:  86.00         Types:  Cigarettes         Quit date:  06/10/2018         Years since quitting:  2.1         ?  Smokeless tobacco:  Former Network engineer and Sexual Activity         ?  Alcohol use:  Yes              Alcohol/week:  13.0 standard drinks         Types:  6 Cans of beer, 7 Standard drinks or equivalent per week         ?  Drug use:  Never     ?  Sexual activity:  Not on file        Other Topics  Concern        ?  Not on file       Social History Narrative        ?  Not on file          Social Determinants of Health          Financial Resource Strain:         ?  Difficulty of Paying Living Expenses: Not on file       Food Insecurity:         ?  Worried About Running Out of Food in the Last Year: Not on file     ?  Ran Out of Food in the Last Year: Not on file       Transportation Needs:         ?  Lack of Transportation (Medical): Not on file     ?  Lack of Transportation (Non-Medical): Not on file       Physical Activity:         ?  Days of Exercise per Week: Not on file     ?  Minutes of Exercise per Session: Not on file       Stress:         ?  Feeling of Stress : Not on file       Social Connections:         ?  Frequency of Communication with Friends and Family: Not on file     ?  Frequency of Social Gatherings with Friends and Family: Not on file     ?  Attends Religious Services: Not on file     ?  Active Member of Clubs or Organizations: Not on file     ?  Attends Banker Meetings: Not on file     ?  Marital Status: Not on file       Intimate Partner Violence:         ?  Fear of Current or Ex-Partner: Not on file     ?  Emotionally Abused: Not on file     ?  Physically Abused: Not on file     ?  Sexually Abused: Not on file       Housing Stability:         ?  Unable to Pay for Housing in the Last Year: Not on file     ?  Number of Places Lived in the Last Year: Not on file        ?  Unstable Housing in the Last Year: Not on file        No Known  Allergies         Review of Systems   Constitutional: Fever: No   Skin: Rash: No   HEENT: Hearing difficulty: No   Eyes: Blurred vision: No   Cardiovascular: Chest pain: No   Respiratory: Shortness of breath: No   Gastrointestinal: Nausea/vomiting: No   Musculoskeletal: Back pain: No   Neurological: Weakness: No   Psychological: Memory loss: No   Comments/additional findings:       Physical Examination   Visit Vitals      Temp  97.1 ??F (36.2 ??C)     Ht  6' 1.5" (1.867 m)     Wt  185 lb 3.2 oz (84 kg)        BMI  24.10 kg/m??        GU Male 07/19/2020:       Scrotum:   normal    Epididymides: nontender, symmetric     Testes:   Nontender, symmetric, no masses    Urethral Meatus:   No stenosis    Penis:  normal    Foreskin:  circumcised    Anus/Perineum:  normal    Sphincter tone:  normal    Rectum:  No masses    Prostate:  , nontender, no nodules         CC: Ilona Sorrel, MD       Drema Halon, MD   [facility] of [place], [facility]         Medical documentation provided with the assistance of Charlton Amor, medical scribe for Alesia Banda, MD .
